# Patient Record
Sex: Male | Born: 1935 | Race: White | Hispanic: No | Marital: Married | Smoking: Never smoker
Health system: Southern US, Community
[De-identification: ages and names within clinical notes are randomized; demographics above are authoritative.]

## PROBLEM LIST (undated history)

## (undated) DIAGNOSIS — I714 Abdominal aortic aneurysm, without rupture, unspecified: Secondary | ICD-10-CM

## (undated) DIAGNOSIS — I359 Nonrheumatic aortic valve disorder, unspecified: Secondary | ICD-10-CM

## (undated) DIAGNOSIS — Z8619 Personal history of other infectious and parasitic diseases: Secondary | ICD-10-CM

## (undated) DIAGNOSIS — I471 Supraventricular tachycardia, unspecified: Secondary | ICD-10-CM

## (undated) DIAGNOSIS — I509 Heart failure, unspecified: Secondary | ICD-10-CM

## (undated) DIAGNOSIS — I4891 Unspecified atrial fibrillation: Secondary | ICD-10-CM

## (undated) HISTORY — DX: Abdominal aortic aneurysm, without rupture, unspecified: I71.40

## (undated) HISTORY — DX: Supraventricular tachycardia: I47.1

## (undated) HISTORY — DX: Heart failure, unspecified: I50.9

## (undated) HISTORY — PX: INGUINAL HERNIA REPAIR: SHX194

## (undated) HISTORY — DX: Personal history of other infectious and parasitic diseases: Z86.19

## (undated) HISTORY — DX: Supraventricular tachycardia, unspecified: I47.10

## (undated) HISTORY — DX: Nonrheumatic aortic valve disorder, unspecified: I35.9

## (undated) HISTORY — DX: Unspecified atrial fibrillation: I48.91

## (undated) HISTORY — DX: Abdominal aortic aneurysm, without rupture: I71.4

---
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
## 2003-07-06 HISTORY — PX: CHOLECYSTECTOMY: SHX55

## 2004-11-16 ENCOUNTER — Inpatient Hospital Stay (HOSPITAL_COMMUNITY): Admission: EM | Admit: 2004-11-16 | Discharge: 2004-11-23 | Payer: Self-pay | Admitting: Emergency Medicine

## 2004-11-17 ENCOUNTER — Encounter: Payer: Self-pay | Admitting: Cardiology

## 2004-11-17 ENCOUNTER — Encounter (INDEPENDENT_AMBULATORY_CARE_PROVIDER_SITE_OTHER): Payer: Self-pay | Admitting: *Deleted

## 2004-11-18 ENCOUNTER — Ambulatory Visit: Payer: Self-pay | Admitting: Cardiology

## 2007-10-13 ENCOUNTER — Encounter: Payer: Self-pay | Admitting: Cardiology

## 2007-10-13 ENCOUNTER — Ambulatory Visit: Payer: Self-pay | Admitting: Cardiology

## 2007-10-15 ENCOUNTER — Inpatient Hospital Stay (HOSPITAL_COMMUNITY): Admission: EM | Admit: 2007-10-15 | Discharge: 2007-10-17 | Payer: Self-pay | Admitting: Emergency Medicine

## 2007-10-30 ENCOUNTER — Ambulatory Visit: Payer: Self-pay | Admitting: Cardiovascular Disease

## 2007-11-29 ENCOUNTER — Ambulatory Visit: Payer: Self-pay | Admitting: Cardiology

## 2009-02-24 ENCOUNTER — Ambulatory Visit: Payer: Self-pay

## 2009-02-24 ENCOUNTER — Encounter: Payer: Self-pay | Admitting: Cardiology

## 2009-02-27 DIAGNOSIS — I471 Supraventricular tachycardia, unspecified: Secondary | ICD-10-CM | POA: Insufficient documentation

## 2009-02-27 DIAGNOSIS — I359 Nonrheumatic aortic valve disorder, unspecified: Secondary | ICD-10-CM | POA: Insufficient documentation

## 2009-02-27 DIAGNOSIS — Z8679 Personal history of other diseases of the circulatory system: Secondary | ICD-10-CM | POA: Insufficient documentation

## 2009-02-27 DIAGNOSIS — I509 Heart failure, unspecified: Secondary | ICD-10-CM | POA: Insufficient documentation

## 2009-02-27 DIAGNOSIS — I4891 Unspecified atrial fibrillation: Secondary | ICD-10-CM

## 2009-02-28 ENCOUNTER — Ambulatory Visit: Payer: Self-pay | Admitting: Cardiology

## 2009-02-28 DIAGNOSIS — I10 Essential (primary) hypertension: Secondary | ICD-10-CM | POA: Insufficient documentation

## 2009-09-28 ENCOUNTER — Emergency Department (HOSPITAL_BASED_OUTPATIENT_CLINIC_OR_DEPARTMENT_OTHER): Admission: EM | Admit: 2009-09-28 | Discharge: 2009-09-28 | Payer: Self-pay | Admitting: Emergency Medicine

## 2010-03-24 ENCOUNTER — Ambulatory Visit (HOSPITAL_COMMUNITY)
Admission: RE | Admit: 2010-03-24 | Discharge: 2010-03-24 | Payer: Self-pay | Source: Home / Self Care | Admitting: Cardiology

## 2010-03-24 ENCOUNTER — Ambulatory Visit: Payer: Self-pay | Admitting: Cardiology

## 2010-03-24 ENCOUNTER — Ambulatory Visit: Payer: Self-pay

## 2010-08-04 NOTE — Assessment & Plan Note (Signed)
Summary: fu 1 year    Primary Provider:  [facility]  CC:  check up.  History of Present Illness: Mr. Gavin Lane is 77 years who returned for followup management of atrial fibrillation and aortic stenosis. He had paroxysmal fibrillation a couple of years ago after receiving some diet pills. He was with rate control medications but no Coumadin since his respiratory was less than. He noticed [place] score of one. He was on but this was discontinued due to some hallucinations.  He also has AS with a meant AVG of 16 today by echo.  No recent cp, sob, palp.  Current Medications (verified): 1)  Aspirin 81 Mg Tbec (Aspirin) .... Take One Tablet By Mouth Daily 2)  Multivitamins   Tabs (Multiple Vitamin) .Marland Kitchen.. 1  Tab By Mouth Once Daily  Past History:  Family History: Last updated: 03-02-09  Mom died secondary to asthma.  Dad died in a train  accident.  Social History: Last updated: 03/02/09  Lives in [place].  He retired from [employer].  He is married.  No tobacco, alcohol or illicit drug use.  Past Medical History: Reviewed history from 02/28/2009 and no changes required. Current Problems:  AORTIC STENOSIS, MILD (ICD-424.1) PAROXYSMAL SUPRAVENTRICULAR TACHYCARDIA (ICD-427.0) ATRIAL FIBRILLATION, PAROXYSMAL (ICD-427.31) HEART FAILURE (ICD-428.9) ABDOMINAL AORTIC ANEURYSM, HX OF (ICD-V12.50) 1.  normal coronary angiography. 2009 2. Mild elevation of pulmonary wedge pressure of 18, consistent with     diastolic heart failure. 3. Paroxysmal atrial fibrillation. 4. Paroxysmal supraventricular tachycardia. 5. Mild aortic stenosis with mean aortic valve gradient of 15 mmHg     with trivial aortic insufficiency and LVH by echo 02/2009. 6. History of abdominal aneurysm, but only 2.4 x 2.5 cm on CT, not     qualifying for the diagnosis of aneurysm.     Review of Systems       ROS is negative except as outlined in HPI.   Vital Signs:  Patient profile:    77 year old male Height:      76 inches Weight:      221 pounds BMI:     27.00 Pulse rate:   73 / minute Resp:     14 per minute BP sitting:   147 / 85  (left arm)  Vitals Entered By: Kem Parkinson (March 24, 2010 3:27 PM)  Physical Exam  Additional Exam:  Gen. Well-nourished, in no distress   Neck: No JVD, thyroid not enlarged, no carotid bruits Lungs: No tachypnea, clear without rales, rhonchi or wheezes Cardiovascular: Rhythm regular, PMI not displaced,  heart sounds  normal, 2/6 harsh systolic ejection murmur at the left sternal edge, no peripheral edema, pulses normal in all 4 extremities. Abdomen: BS normal, abdomen soft and non-tender without masses or organomegaly, no hepatosplenomegaly. MS: No deformities, no cyanosis or clubbing   Neuro:  No focal sns   Skin:  no lesions    Impression & Recommendations:  Problem # 1:  PAROXYSMAL SUPRAVENTRICULAR TACHYCARDIA (ICD-427.0) He has a history of paroxysmal fibrillation but has had no recurrence. He is [place] Score one. He was intolerant to interpret as it is currently only on aspirin. The following medications were removed from the medication list:    Metoprolol Succinate 50 Mg Xr24h-tab (Metoprolol succinate) .Marland Kitchen... Take one-half tab  tablet by mouth daily as needed His updated medication list for this problem includes:    Aspirin 81 Mg Tbec (Aspirin) .Marland Kitchen... Take one tablet by mouth daily  Problem # 2:  AORTIC STENOSIS, MILD (ICD-424.1) He has mild aortic stenosis. His aortic valve gradient is 16 today. Have some LVH but good function. The following medications were removed from the medication list:    Metoprolol Succinate 50 Mg Xr24h-tab (Metoprolol succinate) .Marland Kitchen... Take one-half tab  tablet by mouth daily as needed  Problem # 3:  HYPERTENSION, BENIGN (ICD-401.1) His blood pressure was elevated today but it runs about 116/70 at home. The following medications were removed from the medication list:    Metoprolol  Succinate 50 Mg Xr24h-tab (Metoprolol succinate) .Marland Kitchen... Take one-half tab  tablet by mouth daily as needed His updated medication list for this problem includes:    Aspirin 81 Mg Tbec (Aspirin) .Marland Kitchen... Take one tablet by mouth daily  Other Orders: EKG w/ Interpretation (93000)  Patient Instructions: 1)  Your physician recommends that you continue on your current medications as directed. Please refer to the Current Medication list given to you today. 2)  Your physician wants you to follow-up in: 1 year with Dr. Shirlee Latch.  You will receive a reminder letter in the mail two months in advance. If you don't receive a letter, please call our office to schedule the follow-up appointment.

## 2010-09-28 LAB — CBC
MCHC: 33.9 g/dL (ref 30.0–36.0)
MCV: 90.9 fL (ref 78.0–100.0)
Platelets: 224 10*3/uL (ref 150–400)
RBC: 5.05 MIL/uL (ref 4.22–5.81)
RDW: 12.5 % (ref 11.5–15.5)

## 2010-09-28 LAB — DIFFERENTIAL
Lymphocytes Relative: 26 % (ref 12–46)
Monocytes Absolute: 0.9 10*3/uL (ref 0.1–1.0)
Neutro Abs: 6.3 10*3/uL (ref 1.7–7.7)
Neutrophils Relative %: 63 % (ref 43–77)

## 2010-09-28 LAB — BASIC METABOLIC PANEL
CO2: 27 mEq/L (ref 19–32)
Chloride: 107 mEq/L (ref 96–112)
Creatinine, Ser: 1.1 mg/dL (ref 0.4–1.5)
GFR calc non Af Amer: >60 mL/min (ref >60)
Glucose, Bld: 102 mg/dL — ABNORMAL HIGH (ref 70–99)
Sodium: 142 mEq/L (ref 135–145)

## 2010-11-17 NOTE — Cardiovascular Report (Signed)
**Note Gavin-Identified via Obfuscation** NAME:  [name], Gavin Lane NO.:  [id number]   MEDICAL RECORD NO.:  [id number]          PATIENT TYPE:  OBV   LOCATION:  4735                         FACILITY:  [facility]   PHYSICIAN:  Rollene Rotunda, MD, FACCDATE OF BIRTH:  March 04, 1930   DATE OF PROCEDURE:  10/16/2007  DATE OF DISCHARGE:                            CARDIAC CATHETERIZATION   PRIMARY CARE PHYSICIAN:  Dr. Nelson Chimes.   CARDIOLOGIST:  Macarthur Critchley. Torelli, MD   PROCEDURE:  Left and right heart catheterization/coronary arteriography.   INDICATIONS:  The patient with chest pain and aortic stenosis.   PROCEDURE NOTE:  Left heart catheterization performed via right femoral  artery, right heart catheterization performed via right femoral vein.  Both vessels are cannulated using the anterior wall puncture.  A #6-  [place] arterial sheath and #7-French venous sheath were inserted via the  Seldinger technique.  Preformed Judkins, pigtail, and a Swan-Ganz  catheter were utilized.  The patient tolerated procedure well and he  left the lab in stable condition.   RESULTS:  Hemodynamics RA mean 6, RV 25/4, PA 41/5 with a mean of 19,  pulmonary capillary pressure mean 18, LV 100/16, AL 95/57, the mean  gradient across the aortic valve was only 6.8.  Cardiac output/cardiac  index (Fick) 4.4/2.  Coronaries left mean was normal.  The LAD had mid  luminal irregularities.  There was two small diagonals which were  normal.  Circumflex in the AV groove was normal.  Ramus intermediate was  large and normal.  Mid obtuse marginal was large and normal.  Posterolaterals x2 were small and normal.  The right coronary artery was  a large dominant vessel.  There was approximately 25% stenosis.  The PDA  was moderate sized and normal.   Left Ventriculogram:  The left ventriculogram was obtained in RAO  projection.  The EF was 65% and normal.   CONCLUSION:  Minimal coronary artery plaque.  Mildly elevated left  ventricular end-diastolic pressure  and pulmonary capillary wedge  pressure.  No significant aortic valve stenosis, but did have some  trouble crossing requiring a straight wire and a long exchange wire.   PLAN:  The patient will have medical management.  Of note, during the  procedure, the patient had significant supraventricular tachycardia with  intermittent sinus rhythm.  He was noted to have sustained  supraventricular  tachycardia on telemetry this morning.  Dr. Juanda Chance has started him on  beta blockers.  This can be followed by Dr. Shelva Majestic going forward.  Also, the patient was noted to have apneic episodes with significant  snoring after getting some sedation.  This can be evaluated as an  outpatient.      Rollene Rotunda, MD, [facility]  Electronically Signed     JH/MEDQ  D:  10/16/2007  T:  10/17/2007  Job:  [id number]   cc:   Macarthur Critchley. Shelva Majestic, M.D.  Dr. Nelson Chimes

## 2010-11-17 NOTE — Assessment & Plan Note (Signed)
[facility]                                 ON-CALL NOTE   NAME:G[facility] Lane, Gavin Lane                      MRN:          [id number]  DATE:10/17/2007                            DOB:          09/25/1929    PRIMARY CARDIOLOGIST:  Dr. Everardo Beals. Brodie.   Mr. Cowles was discharged from the hospital on October 17, 2007.  His  wife stated that she was given a prescription for metoprolol 50 mg  b.i.d.  She stated that he had been on metoprolol 25 mg b.i.d., and she  fell to like he would not tolerate a dose increase.  Upon review of the  records, it turns out that he had been given one dose of metoprolol 50  mg prior to discharge, but his systolic blood pressure before that was  101, heart rate 88.  She did not feel that he would tolerate the  metoprolol 50 mg b.i.d., which was double his previous dose.  She was  uncomfortable getting this filled.  I advised her that it would be  appropriate to get the prescription filled, but take 1/2 tablet twice a  day instead of a whole tablet and discuss the matter with Dr. Juanda Chance,  when she and her husband see him in follow-up.  She was in agreement  with this as the plan of care.      Theodore Demark, PA-C  Electronically Signed      Everardo Beals Juanda Chance, MD, [facility]  Electronically Signed   RB/MedQ  DD: 10/18/2007  DT: 10/18/2007  Job #: [id number]

## 2010-11-17 NOTE — Discharge Summary (Signed)
NAME:  ADRIELL, POLANSKY NO.:  [id number]   MEDICAL RECORD NO.:  [id number]          PATIENT TYPE:  INP   LOCATION:  4735                         FACILITY:  [facility]   PHYSICIAN:  Everardo Beals. Juanda Chance, MD, FACCDATE OF BIRTH:  1930/03/30   DATE OF ADMISSION:  10/13/2007  DATE OF DISCHARGE:  10/17/2007                               DISCHARGE SUMMARY   PRIMARY CARDIOLOGIST:  Everardo Beals. Juanda Chance, MD.   PRIMARY CARE Kaivon Livesey:  Dr. Nelson Chimes.   DISCHARGE DIAGNOSIS:  Chest pain.   SECONDARY DIAGNOSES:  1. Paroxysmal atrial fibrillation, currently refusing Coumadin      therapy.  2. Paroxysmal SVT.  3. Mild aortic stenosis.  4. Reported abdominal aortic aneurysm with normal aorta at this      admission.  5. History of nephrolithiasis.  6. Moderate aortic insufficiency.  7. Nonobstructive coronary artery disease.   ALLERGIES:  NO KNOWN DRUG ALLERGIES.   PROCEDURE:  Left heart cardiac catheterization to the echocardiogram.   HISTORY OF PRESENT ILLNESS:  A 78 year old male without prior history of  coronary artery disease.  He is in his usual state of health until October 13, 2007, when he had a sudden onset of substernal chest tightness with  decrease in visual field, lasting approximately 30 minutes, resolving  with rest.  In the emergency room he was known to be orthostatic by  blood pressure and heart rate.  Cardiac markers were normal and ECG  showed sinus tachycardia with no acute ST-T changes.  The patient was  admitted for further evaluation.   HOSPITAL COURSE:  The patient was gently hydrated secondary to  orthostasis and decision was made to pursue left heart cardiac  catheterization, scheduled for October 16, 2007.  The patient ruled out  for MI.  In the early morning hours of October 16, 2007, the patient was  noted to have supraventricular tachycardia which was paroxysmal and  broke spontaneously.  He had underwent left heart cardiac  catheterization on October 16, 2007,  which showed normal coronary arteries  with mild elevation of his wedge at 18 mmHg.  Cardiac output was 4.4 as  well as index was 2.0 liters per minute per meter square.  EF was 65%.  The patient tolerated this procedure well and post catheterization was  noted to have atrial fibrillation.  We discussed initiating Coumadin  therapy.  However, the patient at this time does not want to take  Coumadin.  He will be therefore initiated on aspirin 325 mg daily.  We  have also initiated Lopressor 50 mg b.i.d. which, up to this point, the  patient has tolerated.  We have arranged for discharge today with follow  up with Dr. Regino Schultze nurse practitioner on October 30, 2007, and  subsequent followup with Dr. Juanda Chance on May 27.  Mr. Thain is being  discharged home today in good condition.  With his history of the  abdominal aortic aneurysm, an aortic ultrasound was performed on October 15, 2007, showing no focal aneurysm with aortic maximum diameter of 2.4  x 2.5 cm.   DISCHARGE LABS:  Hemoglobin 15.6, hematocrit  45.8, WBC 11.2, platelets  248, INR 1.1, sodium 140, potassium 4.9, chloride 106, CO2 of 27, BUN  17, creatinine 1.18, glucose 99, calcium 9.0, CK 82, MB 1.7, troponin I  of 0.05, total cholesterol 110, triglycerides 154, HDL 30, and LDL 49.   DISPOSITION:  The patient is being discharged home today in good  condition.   FOLLOWUP PLAN AND APPOINTMENT:  The patient has follow up with Dr.  Regino Schultze nurse practitioner on October 30, 2007, at 8:45 a.m.  Follow up  Dr. Juanda Chance on Nov 29, 2007, at 4:15 p.m.  He has to follow up with Dr.  Nelson Chimes as previously scheduled.   DISCHARGE MEDICATIONS:  1. Lopressor 50 mg b.i.d.  2. Aspirin 325 mg daily.  3. Vitamin D.  4. Niacin.  5. Lypocom.  6. Magnesium.  7. Multivitamin.  8. Flaxseed oil.  9. Fish oil.  10.Juice Plus, all as previously taken.   OUTSTANDING LAB STUDIES:  None.   DURATION DISCHARGE ENCOUNTER:  35 minutes including physician  time.      Nicolasa Ducking, ANP      Bruce R. Juanda Chance, MD, [facility]  Electronically Signed    CB/MEDQ  D:  10/17/2007  T:  10/18/2007  Job:  [id number]   cc:   Dr. Nelson Chimes

## 2010-11-17 NOTE — Assessment & Plan Note (Signed)
[facility]                            CARDIOLOGY OFFICE NOTE   NAME:Gavin Lane, Gavin Lane                    MRN:          [id number]  DATE:10/30/2007                            DOB:          [birth year]    PRIMARY CARDIOLOGIST:  Dr. Juanda Chance.   PRIMARY CARE Judyth Demarais:  Dr. Ivory Broad.   PATIENT PROFILE:  A 77 year old Caucasian male with recent admission for  chest pain who presents for followup.   PROBLEMS:  1. History of chest pain.      a.     October 16, 2007 cardiac catheterization revealing normal       coronary arteries with an EF 65%.  Right heart catheterization       revealed a wedge pressure of 18 mmHg.  Cardiac output was 4.4 and       index was 2.0.  2. Paroxysmal atrial fibrillation refusing Coumadin therapy.  3. Paroxysmal supraventricular tachycardia.  4. Mild aortic stenosis.  5. Moderate aortic insufficiency.  6. History of nephrolithiasis.  7. Reported history of abdominal aortic aneurysm with a CT October 15, 2007 showing no focal aneurysm with aortic maximum diameter 2.4 x      2.5 cm.   HISTORY OF PRESENT ILLNESS:  A 77 year old Caucasian male with the above  problem list.  He was admitted to [facility] on April 10 following  sudden onset of substernal chest tightness with decrease in visual field  lasting 30 minutes and resolving with rest.  In the emergency room he  was noted be orthostatic and cardiac markers were normal.  He was  admitted for evaluation.  He underwent left heart cardiac  catheterization April 13 showing normal coronary arteries, normal LV  function.  During and post catheterization he was having asymptomatic  atrial fibrillation.  He was placed on beta blocker therapy.  The  patient refused Coumadin and was placed on full strength aspirin.  Since  discharge he has done well without recurrence of chest discomfort,  shortness of breath, or palpitations.  He has not had any limitations in  activities.  He did  note some nasal and sinus congestion about a week  ago and when he would blow his nose, there was some blood in his  discharge.  As result of this, his wife cut his aspirin from 325 to 2  baby aspirin instead.  He has not had any additional bleeding for about  a week now.  He is tolerating the medications well and his groin has  also healed up well.   ALLERGIES:  No known drug allergies.   HOME MEDICATIONS:  1. Lopressor 50 mg b.i.d.  2. Aspirin 325 mg daily.  3. Vitamin D daily.  4. Niacin daily.  5. Lycopene daily.  6. Magnesium daily.  7. Multivitamin daily.  8. Flax seed oil daily.  9. Fish oil daily.  10.Juice plus daily.   PHYSICAL EXAM:  Blood pressure 146/80.  However, on repeat was 128/56,  heart rate 60, respirations 16, weight is 217 pounds.  Pleasant white male in no acute distress.  Awake,  alert x3.  HEENT is normal.  NEURO:  Grossly intact, nonfocal.  Skin is warm, dry without lesions or masses.  NECK:  No bruits, JVD.  LUNGS:  Respirations are unlabored.  Clear to auscultation.  CARDIAC:  Regular S1, S2.  2/6 systolic murmur loudest the right sternal  border but heard throughout.  ABDOMEN:  Soft, nontender.  Bowel sounds x4.  EXTREMITIES:  Warm, dry, pink.  No clubbing, cyanosis or edema.  Dorsalis pedis post tibial pulses 1+ bilaterally.  The right groin is  clear bleeding, bruising, hematoma.   ACCESSORY CLINICAL FINDINGS:  EKG shows sinus bradycardia rate of 59  beats per minute with a left axis deviation and no acute ST-T changes.   ASSESSMENT:  1. History of chest pain.  The patient underwent catheterization      revealing normal coronary arteries and normal LV function.  2. Paroxysmal atrial fibrillation.  This was asymptomatic.  He is in      sinus rhythm today remains maintained on 50 mg of b.i.d. metoprolol      along with currently 162 mg of aspirin.  We did discuss aspirin      usage versus Coumadin in atrial fibrillation and I advised that       they try 325 mg again.  However, that if he cannot tolerate 325      that 162 will have to do.  The patient and wife reiterated not      interested in Coumadin.  His CHADS score is only 1 for age of 70.      He has no prior history of heart failure, hypertension, diabetes or      stroke.  3. History of paroxysmal SVT no complaints.  He remains on beta      blocker therapy.  4. Mild aortic stenosis and moderate aortic insufficiency.  This was      by echo during his hospitalization.  Follow-up echo in the year.   DISPOSITION:  The patient has followup already scheduled Dr. Juanda Chance in 1  month.      Nicolasa Ducking, ANP  Electronically Signed      Noralyn Pick. Eden Emms, MD, [facility]  Electronically Signed   CB/MedQ  DD: 10/30/2007  DT: 10/30/2007  Job #: [id number]

## 2010-11-17 NOTE — Assessment & Plan Note (Signed)
[facility]                            CARDIOLOGY OFFICE NOTE   Lane, Gavin Lane                    MRN:          [id number]  DATE:11/29/2007                            DOB:          02-15-1930    PRIMARY CARE PHYSICIAN:  [facility] in [facility]   CLINICAL HISTORY:  Gavin Lane is 77 years old and was recently admitted  for chest pain.  He underwent catheterization and was found to have  normal coronary angiography.  He also had paroxysmal atrial fibrillation  and noted while he was in the hospital and there was some discussion  about Coumadin therapy, but he did not want to take Coumadin therapy.  He also has a mild aortic stenosis with a mean aortic valve gradient 12  mmHg.  He had a previous history of aortic aneurysm, but his ultrasound  showed only a size of 2.4 x 2.5 cm, which does not qualify for the  diagnosis of aneurysm.  He had been previously followed by Gavin Lane,  but decided to a continued cardiac followup here with me.   He has done quite well since his discharge from the hospital.  He has  had no recurrent chest pain and no recurrent palpitations.   PAST MEDICAL HISTORY:  Hyperlipidemia.   CURRENT MEDICATIONS:  1. Lopressor 50 mg b.i.d.  2. Niacin.  3. Magnesium.  4. Flax seed oil.  5. Fish oil.  6. Aspirin.   PHYSICAL EXAMINATION:  Blood pressure was 132/80, pulse 79 and regular.  There was no venous tension.  The carotid pulses were full without  bruits.  CHEST:  Was clear.  CARDIAC:  Rhythm was regular.  There is a grade 2/6 harsh systolic  ejection murmur left sternal edge radiating to the base.  I could hear  no diastolic murmur.  S2 was single.  ABDOMEN:  Soft, with normal bowel sounds.  There is no  hepatosplenomegaly.  Peripheral pulses were full. There is no peripheral edema.   IMPRESSION:  1. Recent admission for chest pain with normal coronary angiography.  2. Mild elevation of pulmonary  wedge pressure of 18, consistent with      diastolic heart failure.  3. Paroxysmal atrial fibrillation.  4. Paroxysmal supraventricular tachycardia.  5. Mild aortic stenosis with mean aortic valve gradient of 12 mmHg      with trivial aortic insufficiency.  6. History of abdominal aneurysm, but only 2.4 x 2.5 cm on CT, not      qualifying for the diagnosis of aneurysm.   RECOMMENDATIONS:  I think Gavin Lane is doing quite well.  He did have  some hallucination which he possibly related to the Lopressor, but these  have resolved and are not currently a problem.  Will plan to continue on  Lopressor and aspirin.  We also discussed Coumadin therapy again.  He is  [place] score 1, based on his age.  I think it is not unreasonable to treat  him with aspirin alone.  I will plan to see him back in a year.  Will do  a followup echocardiogram prior  to that visit.  If he has any recurrence  of symptoms of tachy palpitations, he is let us know.     Bruce Elvera Lennox Juanda Chance, MD, [facility]  Electronically Signed    BRB/MedQ  DD: 11/29/2007  DT: 11/29/2007  Job #: [id number]

## 2010-11-17 NOTE — Discharge Summary (Signed)
NAME:  Gavin Lane, Gavin Lane NO.:  [id number]   MEDICAL RECORD NO.:  [id number]          PATIENT TYPE:  OBV   LOCATION:  4735                         FACILITY:  [facility]   PHYSICIAN:  Theodore Demark, PA-C   DATE OF BIRTH:  Nov 01, 1929   DATE OF ADMISSION:  10/13/2007  DATE OF DISCHARGE:                               DISCHARGE SUMMARY   PRIMARY CARE PHYSICIAN:  Dr. Nelson Chimes at [facility] practice.   PRIMARY CARDIOLOGIST:  Macarthur Critchley. Shelva Majestic, MD   CHIEF COMPLAINT:  Chest pain.   HISTORY OF PRESENT ILLNESS:  Mr. Haberle is a 77 year old male with no  previous history of coronary artery disease.  He had sudden onset of  dizziness that was associated with chest tightness and decrease in his  visual field today at approximately 10 a.m.  The chest pain reached to  5/10.  There were no associated symptoms and there were no palpitations.  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  He rested but he still had symptoms, however, they resolved in  about 30 minutes.  He did not take any medications or try anything.  They were not positional and the pain did not change with deep  inspiration.  He has never had symptoms like this before.  Approximately  a month ago, he had a balance problem while cleaning a horse stall and  stated that he had to keep putting the hand to the wall to maintain his  balance.  However, there was no associated chest pain, dizziness, or  changes in his visual field with this, so he feels that these symptoms  were not like the ones he had today and they also resolved  spontaneously.   PAST MEDICAL HISTORY:  1. He has known history of diabetes, hypertension, hyperlipidemia,      family history of premature coronary artery disease or significant      tobacco use.  2. Status post cardiac evaluation preoperatively in 2006 with an      echocardiogram showed mild aortic stenosis with a mean gradient of      50 mmHg.  3. AAA  diagnosed in 2006 of 3.4 cm, checked by Dr. Shelva Majestic in 2007      with no further details available.  4. History of nephrolithiasis.   SURGICAL HISTORY:  He is status post lithotripsy, cholecystectomy, and  cataract surgery.   ALLERGIES:  No known drug allergies.   CURRENT MEDICATIONS:  1. Aspirin 81 mg a day.  2. Multivitamin and multiple other vitamin supplements daily.   SOCIAL HISTORY:  Lives in [place] with his wife and is retired from  [employer].  He used to smoke cigars occasionally, but quit  completely 2 years ago, and has never abused alcohol or drugs.   FAMILY HISTORY:  His mother died at age 67 with a history of pneumonia  but not heart disease, and his father was killed during World War II at  age 70 but did not have heart disease.  He has 1 sister that is  deceased, but she did not  have heart disease.   REVIEW OF SYSTEMS:  He had a mild upper respiratory infection that did  not require antibiotic therapy last week but this has resolved.  He  coughs occasionally.  He does not complain much of thirst and says he  does not drink very much water but drinks a lot of tea.  He denies  indigestion, heartburn, or reflux.  There has been no hematemesis,  hemoptysis, or melena.  He has had no fevers or chills.  A 14-point  review of systems is otherwise negative.   PHYSICAL EXAMINATION:  VITAL SIGNS:  Temperature is 98.5, blood pressure  103/52, pulse 106, respiratory rate 16, O2 saturation 95% on room air.  GENERAL:  He is a well-developed, well-nourished white male in no acute  distress.  HEENT:  Normal.  NECK:  There is no lymphadenopathy, thyromegaly, or JVD noted, but his  murmur does radiate to his left carotid more so than his right carotid.  CV:  His heart is regular in rate and rhythm with an S1-S2, and a 2/6  typical aortic stenosis murmur is noted at the left upper sternal  border.  Distal pulses are intact in all 4 extremities.  LUNGS:  Clear to  auscultation bilaterally.  SKIN:  No rashes or lesions are noted.  ABDOMEN:  Soft, nontender with active bowel sounds.  EXTREMITIES:  There is no cyanosis, clubbing, or edema noted.  MUSCULOSKELETAL:  There is no joint deformity or effusions and no spine  or CVA tenderness.  NEURO:  He is alert and oriented.  Cranial nerves II-XII grossly intact.   Chest x-ray shows mild COPD but no acute cardiopulmonary disease.   EKG is sinus tachycardia rate 101 with no acute ischemic changes.   LABORATORY VALUES:  Hemoglobin 16.1, hematocrit 47, WBCs 11.1, platelets  245,000.  Sodium 138, potassium 4.0, chloride 107, CO2 22, BUN 60,  creatinine 1.1, glucose 101.  GFR greater than 30, PTT 36.  Troponin I  0.03, CK-MB 130/2.6, and lipid profile is pending.   IMPRESSION:  1. Chest pain.  We will admit and rule out myocardial infarction.  If      his cardiac enzymes are negative, outpatient Myoview can be      considered but a treadmill test would probably be best because of      his aortic stenosis.  2. Aortic stenosis.  We will check an echocardiogram to assess his      valve and his EF.  3. Orthostatic hypotension:  His blood pressure lying was 125/81 with      a heart rate of 94 and standing was 103/62 with a heart rate of      106.  Because of the aortic stenosis, he will be hydrated gently.      He is encouraged to substitute water for the tea but not force      fluid.      Theodore Demark, PA-C     RB/MEDQ  D:  10/13/2007  T:  10/14/2007  Job:  [id number]   cc:   Noralee Chars, MD  Macarthur Critchley. Shelva Majestic, M.D.

## 2010-11-20 NOTE — Discharge Summary (Signed)
NAME:  ALAIN, DESCHENE NO.:  [id number]   MEDICAL RECORD NO.:  [id number]          PATIENT TYPE:  INP   LOCATION:  5002                         FACILITY:  [facility]   PHYSICIAN:  Adolph Pollack, M.D.DATE OF BIRTH:  1929/08/30   DATE OF ADMISSION:  11/16/2004  DATE OF DISCHARGE:  11/23/2004                                 DISCHARGE SUMMARY   DISCHARGE DIAGNOSES:  1.  Acute cholecystitis status post laparoscopic cholecystectomy with      intraoperative cholangiogram on Nov 17, 2004.  2.  Mild aortic stenosis.  3.  Small abdominal aortic aneurysm 3.4 cm.  4.  History of nephrolithiasis status post lithotripsy.   HOSPITAL COURSE:  Mr. Gavin Lane is a 78 year old male patient who presented  with a one-day history of intractable right upper quadrant pain associated  with nausea and vomiting.  He had a similar episode two months ago that  spontaneously resolved.  In the emergency room a gallbladder ultrasound  showed cholelithiasis and blood work showed leukocytosis.  He was placed on  IV antibiotics, pain control and the following day he underwent a  cholecystectomy laparoscopically with intraoperative cholangiogram.  He was  found to have acute cholecystitis with a gangrenous gallbladder.  A drain  was placed intraoperatively.   During his hospital stay he was noted to have a systolic murmur and he was  seen by Dr. Charlies Constable of [facility].  He was found to have mild  aortic stenosis.  He will follow this up with Dr. Harland Dingwall in [facility]   In addition, he was found to have a 3.4 cm abdominal aortic aneurysm when he  did have his abdominal ultrasound.  We have asked him to follow up with Dr.  Shelva Majestic regarding the aneurysm as well.   Postoperatively he did well.  The only complication is that he did have a  postoperative ileus that has resolved upon discharge.   On postoperative day #6 he was ready for discharge to home.  He was  discharged home  in stable condition.  He was given a post cholecystectomy  discharge sheet which included his activity, meals, wound care.   His only medication is Vicodin 5/500 one to two tablets every six hours as  needed for pain, #30, no refills.  I have set him up an appointment to  follow up in the office.      LB/MEDQ  D:  11/23/2004  T:  11/23/2004  Job:  [id number]   cc:   Vikki Ports, MD  [address]  [place]  [place] [address]   Macarthur Critchley. Shelva Majestic, M.D.  [address]  [address]  [place]  [place] [address]  Fax: [phone]

## 2010-11-20 NOTE — Op Note (Signed)
NAME:  ZIA, KANNER NO.:  [id number]   MEDICAL RECORD NO.:  [id number]          PATIENT TYPE:  INP   LOCATION:  5002                         FACILITY:  [facility]   PHYSICIAN:  Vikki Ports, MDDATE OF BIRTH:  1930/04/12   DATE OF PROCEDURE:  11/17/2004  DATE OF DISCHARGE:                                 OPERATIVE REPORT   PREOPERATIVE DIAGNOSIS:  Acute cholecystitis.   POSTOPERATIVE DIAGNOSIS:  Gangrenous cholecystitis.   PROCEDURE:  Laparoscopic cholecystectomy with intraoperative cholangiogram.   SURGEON:  Vikki Ports, M.D.   ASSISTANT:  Adolph Pollack, M.D.   ANESTHESIA:  General.   DESCRIPTION OF PROCEDURE:  The patient was taken to the operating room and  placed in the supine position.  After adequate general anesthesia was  induced, using an endotracheal tube, the abdomen was prepped and draped in a  normal sterile fashion.  Using a transverse infraumbilical incision, I  dissected down to the fascia and this was opened vertically.  An 0 Vicryl  pursestring suture was placed around the fascial defect.  The Hasson trocar  was placed in the abdomen and the abdomen was insufflated with continuous  flow carbon dioxide.  Under the gallbladder which was very edematous and had  an enormous stone within it was decompressed using the Nilstat aspirator.  Grasping the gallbladder was very, very difficult.  It was very large and  had a fist-sized stone within it.  Dissection down to the neck visualized a  cystic duct.  The dissection was difficult but a good window was created  posterior to it.  The duct looked somewhat necrotic but cholangiogram was  performed which showed normal flow into the duodenum.  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  There was no evidence of erosion  into the duct.  The duct was then doubly clipped.  The cystic artery  was  dissected in a similar fashion, clipped and divided.  The gallbladder was  taken off the gallbladder bed with some difficulty using electrocautery.  Adequate hemostasis was assured and Surgicel was placed within the  gallbladder bed.  The gallbladder was placed in an endo-catch bag.  No  feeling completely comfortable with the closure of the cystic duct secondary  to the amount of inflammation, I opted to place a 19 Blake drain near the  cystic duct stump.  The gallbladder was then brought toward the umbilical  incision but because of the enormous size of the stone, the fascial incision  was extended to about 4-5 cm to allow removal of the gallbladder.  The  fascia was then closed with interrupted #1 Novofil.  The skin incisions were  closed with staples.  Dressings were applied.  The patient tolerated the  procedure well and went to PACU in good condition.     KRH/MEDQ  D:  11/19/2004  T:  11/19/2004  Job:  [id number]

## 2010-11-20 NOTE — H&P (Signed)
NAME:  Gavin Lane, Gavin Lane NO.:  [id number]   MEDICAL RECORD NO.:  [id number]          PATIENT TYPE:  INP   LOCATION:  1826                         FACILITY:  [facility]   PHYSICIAN:  Vikki Ports, MDDATE OF BIRTH:  12/02/1929   DATE OF ADMISSION:  11/16/2004  DATE OF DISCHARGE:                                HISTORY & PHYSICAL   REASON FOR CONSULTATION TO THE ER:  Cholecystitis.   HISTORY OF PRESENT ILLNESS:  Mr. Renz is a 77 year old male patient who  had an episode of right upper quadrant pain about two months ago, with  spontaneous resolution.  He experienced another episode of the same pain  yesterday and this became intractable.  The pain worsened and he presented  to the emergency room.  He has had right upper quadrant pain, nausea, as  well as vomiting.  No diarrhea.  The gallbladder ultrasound revealed  cholelithiasis.  He has a fever, as well as leukocytosis.  We have been  consulted by the emergency room physician for admission, as well as a  cholecystectomy.   ALLERGIES:  No known drug allergies.   MEDICATIONS:  Nexium p.r.n.   PAST MEDICAL HISTORY:  Nephrolithiasis, with what sounds to be a  lithotripsy.   SOCIAL HISTORY:  Lives in [place].  He retired from [employer].  He is married.  No tobacco, alcohol or illicit drug use.   FAMILY HISTORY:  Mom died secondary to asthma.  Dad died in a train  accident.   REVIEW OF SYSTEMS:  Nausea, vomiting, right upper quadrant pain.  Not noted  chest pain, shortness of breath, and all other systems are negative.   PHYSICAL EXAMINATION:  VITAL SIGNS:  Temperature 100.6 degrees, pulse 76,  respirations 20, blood pressure 140/90.  GENERAL:  He is in no acute distress.  HEENT:  Grossly normal.  Discharge through the nares clear.  Conjunctivae  normal.  NECK:  No carotid or supraclavicular bruits.  No jugular venous distention  or thyromegaly.  NODES:  No lymphadenopathy.  HEART:   A regular rate and rhythm.  No murmurs or ectopy.  LUNGS:  Clear.  ABDOMEN:  Soft, nondistended.  No masses or bruits.  No scars or herniae.  He does have positive Murphy's sign with right upper quadrant pain.  SKIN:  Warm and dry.  EXTREMITIES:  No clubbing, cyanosis or edema.  MUSCULOSKELETAL:  No joint deformities or CVA tenderness.  NEUROLOGIC:  Cranial nerves II-XII  grossly intact.  Alert and oriented x3.   An ultrasound shows cholelithiasis with sludge, positive Murphy's sign,  common bile duct 7 mm.  There is an incidental finding of an abdominal  aortic aneurysm of 3.4 cm.   LABORATORY DATA:  Reveals leukocytosis of 19,500.  His T-bilirubin is 2.1,  otherwise his liver function tests are normal.   ASSESSMENT:  1. Acute cholelithiasis.  2. History of nephrolithiasis.     PLAN:  At this point we will place him on antibiotics, pain control and plan  for a cholecystectomy in the morning.      LB/MEDQ  D:  11/16/2004  T:  11/16/2004  Job:  [id number]

## 2010-11-20 NOTE — Consult Note (Signed)
NAME:  Gavin Lane, Gavin Lane NO.:  [id number]   MEDICAL RECORD NO.:  [id number]          PATIENT TYPE:  INP   LOCATION:  5002                         FACILITY:  [facility]   PHYSICIAN:  Charlies Constable, M.D. [facility] DATE OF BIRTH:  1930-05-08   DATE OF CONSULTATION:  11/17/2004  DATE OF DISCHARGE:                                   CONSULTATION   REQUESTING PHYSICIANS:  Vikki Ports, M.D., and Burna Forts,  M.D.   REASON FOR CONSULTATION:  Preoperative evaluation prior to gallbladder  surgery.   CLINICAL HISTORY:  Mr. Bordas is 77 years old.  He has no prior history of  known heart disease.  He recently was evaluated for abdominal pain with an  ultrasound and found to have a grossly distended gallbladder.  We are seeing  him in the PACU for a preop evaluation because of a new heart murmur that  was detected on examination.  He has no prior history of known heart disease  and no history of a previous heart murmur.  He has had no symptoms of chest  pain, shortness of breath or palpitations.   PAST MEDICAL HISTORY:  Significant for nephrolithiasis and lithotripsy.  He  has also had cataract surgery.  There is no history of hypertension,  hyperlipidemia, diabetes, tobacco use.   He is currently on  no medications at home.   SOCIAL HISTORY:  He lives in [place] with his wife.  He is retired from  [employer].   For details of family history, social history and review of systems, please  see the complete note of Gene Serpe, P.A.   PHYSICAL EXAMINATION:  VITAL SIGNS:  Blood pressure is 109/92 and the pulse  90 and regular.  VITAL SIGNS:  There was no venous distention.  The carotid pulses were full.  I could not hear any definite transmitted murmur to the carotids.  CHEST:  Clear without rales or rhonchi.  CARDIAC:  Heart rhythm was regular.  The first heart sound was normal.  The  second heart sound was preserved.  There was a grade 2-3/6 systolic  ejection  murmur at the left sternal edge radiating toward the base.  I could hear no  diastolic murmur, and I could hear no gallop.  ABDOMEN:  Right upper quadrant tenderness.  Bowel sounds were present.  I  did not push hard on the gallbladder because of his known infection.  EXTREMITIES:  No edema, and the pedal pulses were good.  MUSCULOSKELETAL:  No deformity.  NEUROLOGIC:  No focal neurologic signs.   Chest x-ray showed bilateral atelectasis.  There is a question of right lung  nodularity at the base.  An ECG was normal.   IMPRESSION:  1.  Preoperative evaluation prior to gallbladder surgery for cholecystitis.  2.  Systolic murmur consistent with aortic stenosis, mild.   RECOMMENDATIONS:  Dr. Jacklynn Bue did a portable echo, and his interpretation  was that this showed a mild aortic stenosis and mild aortic insufficiency  with mild thickening of the left ventricular walls.  I think the patient is  okay to  proceed with surgery.  He is currently on antibiotics for his  gallbladder, which should cover him for SBE prophylaxis.  Will help to  arrange cardiology follow-up before his discharge from the hospital.      BB/MEDQ  D:  11/17/2004  T:  11/17/2004  Job:  [id number]   cc:   Vikki Ports, MD  [address]  [place]  [place] [address]   Burna Forts, M.D.  [facility] [address]  [place]  [place] [address]  Fax: [phone]

## 2011-03-30 LAB — BASIC METABOLIC PANEL
BUN: 16
CO2: 27
Calcium: 9
Calcium: 9.3
Chloride: 106
Chloride: 107
GFR calc Af Amer: >60
GFR calc non Af Amer: >60
Glucose, Bld: 101 — ABNORMAL HIGH
Potassium: 4
Sodium: 138
Sodium: 140

## 2011-03-30 LAB — PROTIME-INR
INR: 1.1
Prothrombin Time: 13.1
Prothrombin Time: 14.1

## 2011-03-30 LAB — CBC
HCT: 45.8
Hemoglobin: 15.6
MCHC: 34.1
MCHC: 34.3
MCV: 89.7
MCV: 90.1
Platelets: 248
RBC: 5.25
WBC: 11.1 — ABNORMAL HIGH

## 2011-03-30 LAB — APTT: aPTT: 36

## 2011-03-30 LAB — POCT I-STAT 3, VENOUS BLOOD GAS (G3P V)
O2 Saturation: 59
Operator id: 256671
TCO2: 26
pCO2, Ven: 41.5 — ABNORMAL LOW
pH, Ven: 7.382 — ABNORMAL HIGH

## 2011-03-30 LAB — CK TOTAL AND CKMB (NOT AT ARMC): Relative Index: 2

## 2011-03-30 LAB — LIPID PANEL
HDL: 30 — ABNORMAL LOW
Triglycerides: 154 — ABNORMAL HIGH

## 2011-03-30 LAB — POCT I-STAT 3, ART BLOOD GAS (G3+)
Bicarbonate: 22.9
pCO2 arterial: 32.7 — ABNORMAL LOW
pH, Arterial: 7.454 — ABNORMAL HIGH
pO2, Arterial: 55 — ABNORMAL LOW

## 2011-03-30 LAB — CARDIAC PANEL(CRET KIN+CKTOT+MB+TROPI)
Relative Index: 1.6
Relative Index: INVALID
Total CK: 112
Troponin I: 0.05
Troponin I: 0.06

## 2011-03-30 LAB — TROPONIN I: Troponin I: 0.03

## 2011-05-26 ENCOUNTER — Encounter: Payer: Self-pay | Admitting: *Deleted

## 2011-05-31 ENCOUNTER — Encounter: Payer: Self-pay | Admitting: Cardiology

## 2011-05-31 ENCOUNTER — Ambulatory Visit (INDEPENDENT_AMBULATORY_CARE_PROVIDER_SITE_OTHER): Payer: Medicare Other | Admitting: Cardiology

## 2011-05-31 DIAGNOSIS — I4891 Unspecified atrial fibrillation: Secondary | ICD-10-CM

## 2011-05-31 DIAGNOSIS — I359 Nonrheumatic aortic valve disorder, unspecified: Secondary | ICD-10-CM

## 2011-05-31 DIAGNOSIS — I10 Essential (primary) hypertension: Secondary | ICD-10-CM

## 2011-05-31 DIAGNOSIS — R0602 Shortness of breath: Secondary | ICD-10-CM

## 2011-05-31 LAB — LIPID PANEL
Cholesterol: 120 mg/dL (ref 0–200)
VLDL: 31.6 mg/dL (ref 0.0–40.0)

## 2011-05-31 LAB — CBC WITH DIFFERENTIAL/PLATELET
Basophils Absolute: 0 10*3/uL (ref 0.0–0.1)
Eosinophils Absolute: 0.2 10*3/uL (ref 0.0–0.7)
Hemoglobin: 16.6 g/dL (ref 13.0–17.0)
Lymphocytes Relative: 23 % (ref 12.0–46.0)
MCHC: 34.4 g/dL (ref 30.0–36.0)
Monocytes Relative: 8.3 % (ref 3.0–12.0)
Neutrophils Relative %: 66.7 % (ref 43.0–77.0)
Platelets: 235 10*3/uL (ref 150.0–400.0)
RDW: 13.2 % (ref 11.5–14.6)

## 2011-05-31 LAB — HEPATIC FUNCTION PANEL
ALT: 37 U/L (ref 0–53)
AST: 40 U/L — ABNORMAL HIGH (ref 0–37)
Bilirubin, Direct: 0.2 mg/dL (ref 0.0–0.3)
Total Protein: 7.1 g/dL (ref 6.0–8.3)

## 2011-05-31 LAB — BASIC METABOLIC PANEL
BUN: 14 mg/dL (ref 6–23)
Calcium: 9.4 mg/dL (ref 8.4–10.5)
Creatinine, Ser: 1.1 mg/dL (ref 0.4–1.5)
GFR: 67.14 mL/min (ref >60.00)
Potassium: 3.8 mEq/L (ref 3.5–5.1)

## 2011-05-31 LAB — BRAIN NATRIURETIC PEPTIDE: Pro B Natriuretic peptide (BNP): 24 pg/mL (ref 0.0–100.0)

## 2011-05-31 NOTE — Assessment & Plan Note (Signed)
Mild to moderate AS on last echo in 9/11.  No symptoms referrable to AS.  Would repeat echo in 1-2 years depending on symptoms.

## 2011-05-31 NOTE — Assessment & Plan Note (Signed)
1 documented episode several years ago associated with use of a stimulant diet pill.  He has avoided these since.  He has not tolerated beta blockers due hallucinations.  No recent symptoms consistent with atrial fibrillation.  Continue ASA.  If has recurrence, will need anticoagulation.

## 2011-05-31 NOTE — Progress Notes (Signed)
PCP:  Dr. Ivory Broad in [place]  77 yo with history of paroxysmal atrial fibrillation and mild to moderate AS returns for cardiology followup.  Patient has seen Dr. Juanda Chance in the past and is seeing me for the first time today.  He had an episode of atrial fibrillation in 2007 or 2008 after taking a diet pill.  He has not had a documented recurrence.  He has been on aspirin but not coumadin.  He denies tachypalpitations.  He is in sinus rhythm today.  Last echo showed mild AS by mean gradient though visually AS appeared more moderate.  Patient is active in general.  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  No chest pain, no exertional dyspnea.  No syncope/presyncope.    ECG: NSR, iRBBB, LAFB  Labs (3/11): creatinine 1.1  PMH: 1. Paroxysmal atrial fibrillation: 1 episode in 2007 or 2008 after taking a diet pill.  Has not had a documented recurrence.  He is not on coumadin.  He had hallucinations with use of beta blocker.  2. Aortic stenosis: Mild to moderate.  Echo (9/11) with EF 55%, mean aortic valve gradient 16 mmHg but visually AS appeared moderate.   3. S/p cholecystectomy 4. Left heart cath in 2009 showed normal coronaries.   SH: Lives with wife in [place].  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  Nonsmoker.   FH: Father died in car accident.  Mother had asthma.   ROS: All systems reviewed and negative except as per HPI.   Current Outpatient Prescriptions  Medication Sig Dispense Refill  . aspirin 81 MG tablet Take 81 mg by mouth daily.        . MULTIPLE VITAMIN PO Take 1 tablet by mouth daily.        . NON FORMULARY zylamend 1 capsule daily       . Omega-3 Fatty Acids (FISH OIL CONCENTRATE PO) Take by mouth. 1 teaspoon          BP 154/92  Pulse 77  Ht 6\' 4"  (1.93 m)  Wt 98.793 kg (217 lb 12.8 oz)  BMI 26.51 kg/m2 General: NAD, overweight Neck: No JVD, no thyromegaly or thyroid nodule.  Lungs: Clear to auscultation bilaterally with normal respiratory  effort. CV: Nondisplaced PMI.  Heart regular S1/S2, no S3/S4, 2/6 early to mid-peaking systolic crescendo-decrescendo murmur RUSB.  No peripheral edema.  No carotid bruit.  Normal pedal pulses.  Abdomen: Soft, nontender, no hepatosplenomegaly, no distention.  Neurologic: Alert and oriented x 3.  Psych: Normal affect. Extremities: No clubbing or cyanosis.

## 2011-05-31 NOTE — Assessment & Plan Note (Addendum)
BP is high today but has not been high when he checks at home.  He will check daily and record results for 2 weeks, we will call at that time to see what BP runs at home.   Check lipids today.

## 2011-05-31 NOTE — Patient Instructions (Addendum)
Your physician recommends that you have a FASTING lipid profile /liver profile/CBC/BMET/BNP 427.31  424.1  Your physician recommends that you schedule an appointment to establish with Dr  Marguarite Arbour at [facility] in [facility] for your primary care doctor.  Take and record your blood pressure about 2 hours after you take your medication. I will call you in 2 weeks to get the readings. [name] [phone]  Your physician wants you to follow-up in: 1 year with Dr Shirlee Latch. (November 2013).You will receive a reminder letter in the mail two months in advance. If you don't receive a letter, please call our office to schedule the follow-up appointment.

## 2011-06-01 ENCOUNTER — Telehealth: Payer: Self-pay | Admitting: Cardiology

## 2011-06-01 NOTE — Telephone Encounter (Signed)
FU Call: Pt wife returning call to [facility] to get results of pt lab work. Please return pt wife call to discuss further.

## 2011-06-01 NOTE — Telephone Encounter (Signed)
I talked with pt's wife about recent lab results

## 2011-06-02 ENCOUNTER — Ambulatory Visit: Payer: Self-pay | Admitting: Cardiology

## 2011-06-16 ENCOUNTER — Telehealth: Payer: Self-pay | Admitting: *Deleted

## 2011-06-16 NOTE — Telephone Encounter (Signed)
HYPERTENSION, BENIGN - Marca Ancona, MD 05/31/2011 1:38 PM Addendum  BP is high today but has not been high when he checks at home. He will check daily and record results for 2 weeks, we will call at that time to see what BP runs at home.   06/16/11-LMTCB for pt to get recent BP readings.

## 2011-06-18 NOTE — Telephone Encounter (Signed)
I talked with pt. Pt is  out of town and does not have BP readings with him. He will call me back this afternoon or Monday with the BP readings.

## 2011-06-21 NOTE — Telephone Encounter (Signed)
BPs are good.  No changes.

## 2011-06-21 NOTE — Telephone Encounter (Signed)
Discussed with pt

## 2011-06-21 NOTE — Telephone Encounter (Signed)
Pt spouse rtning your call/lg

## 2011-06-21 NOTE — Telephone Encounter (Signed)
Per pt's wife--recent BP readings--06/01/11 129/87  06/02/11 133/78  06/04/11 133/75  06/07/11 119/72  06/08/11 129/72  06/09/11 136/73  06/10/11 122/79  06/11/11 139/75 --pt left for fishing trip to [place] with his granddaughter-no more readings. I will forward to Dr Shirlee Latch for review.

## 2011-07-27 ENCOUNTER — Encounter: Payer: Self-pay | Admitting: Internal Medicine

## 2011-07-27 ENCOUNTER — Ambulatory Visit (INDEPENDENT_AMBULATORY_CARE_PROVIDER_SITE_OTHER): Payer: Medicare Other | Admitting: Internal Medicine

## 2011-07-27 DIAGNOSIS — R945 Abnormal results of liver function studies: Secondary | ICD-10-CM

## 2011-07-27 DIAGNOSIS — IMO0002 Reserved for concepts with insufficient information to code with codable children: Secondary | ICD-10-CM

## 2011-07-27 DIAGNOSIS — R739 Hyperglycemia, unspecified: Secondary | ICD-10-CM

## 2011-07-27 DIAGNOSIS — R7989 Other specified abnormal findings of blood chemistry: Secondary | ICD-10-CM

## 2011-07-27 DIAGNOSIS — R7309 Other abnormal glucose: Secondary | ICD-10-CM | POA: Diagnosis not present

## 2011-07-27 DIAGNOSIS — M6740 Ganglion, unspecified site: Secondary | ICD-10-CM | POA: Diagnosis not present

## 2011-07-27 LAB — HEPATIC FUNCTION PANEL
Alkaline Phosphatase: 151 U/L — ABNORMAL HIGH (ref 39–117)
Indirect Bilirubin: 0.6 mg/dL (ref 0.0–0.9)
Total Bilirubin: 0.8 mg/dL (ref 0.3–1.2)

## 2011-07-27 LAB — HEMOGLOBIN A1C: Mean Plasma Glucose: 111 mg/dL (ref <117)

## 2011-07-31 DIAGNOSIS — IMO0002 Reserved for concepts with insufficient information to code with codable children: Secondary | ICD-10-CM | POA: Insufficient documentation

## 2011-07-31 DIAGNOSIS — R7989 Other specified abnormal findings of blood chemistry: Secondary | ICD-10-CM | POA: Insufficient documentation

## 2011-07-31 DIAGNOSIS — R945 Abnormal results of liver function studies: Secondary | ICD-10-CM | POA: Insufficient documentation

## 2011-07-31 DIAGNOSIS — R739 Hyperglycemia, unspecified: Secondary | ICD-10-CM | POA: Insufficient documentation

## 2011-07-31 NOTE — Assessment & Plan Note (Signed)
Asx. Hand surgery consult if becomes symptomatic

## 2011-07-31 NOTE — Progress Notes (Signed)
  Subjective:    Patient ID: Gavin Lane, male    DOB: September 23, 1929, 78 y.o.   MRN: [id number]  HPI Pt presents to clinic to establish care and for evaluation of multiple medical problems. H/o AAA with reported multiple f/u US's negative for aneurysm. Left palm has asx cyst without pain.  H/o PAF and mod AS without sx's followed by cardiology. BP minimally elevated but nl on home monitoring. No active complaint.  Past Medical History  Diagnosis Date  . Aortic valve disorders   . Paroxysmal supraventricular tachycardia   . Atrial fibrillation   . Heart failure, unspecified   . AAA (abdominal aortic aneurysm)   . History of shingles    Past Surgical History  Procedure Date  . Cholecystectomy 2005  . Inguinal hernia repair over 30 years ago  . Cystoscopy 1945    kidney stones    reports that he has never smoked. He has never used smokeless tobacco. He reports that he does not drink alcohol or use illicit drugs. family history includes Asthma in his mother.  There is no history of Colon cancer, and Breast cancer, and Diabetes, and Prostate cancer, and Hypertension, and Heart disease, . No Known Allergies   Review of Systems  Respiratory: Negative for shortness of breath.   Cardiovascular: Negative for chest pain.  All other systems reviewed and are negative.       Objective:   Physical Exam  Nursing note and vitals reviewed. Constitutional: He appears well-developed and well-nourished. No distress.  HENT:  Head: Normocephalic and atraumatic.  Right Ear: External ear normal.  Left Ear: External ear normal.  Eyes: Conjunctivae are normal. No scleral icterus.  Neck: Neck supple.  Cardiovascular: Normal rate and regular rhythm.   Murmur heard. Pulmonary/Chest: Effort normal and breath sounds normal. No respiratory distress. He has no wheezes. He has no rales.  Neurological: He is alert.  Skin: Skin is warm and dry. He is not diaphoretic.       Well circumscribed ST mass of  left palm. Mobile and nt.  Psychiatric: He has a normal mood and affect.          Assessment & Plan:

## 2011-07-31 NOTE — Assessment & Plan Note (Signed)
Obtain a1c.  

## 2011-07-31 NOTE — Assessment & Plan Note (Signed)
Repeat lft

## 2011-08-02 ENCOUNTER — Other Ambulatory Visit: Payer: Self-pay | Admitting: Internal Medicine

## 2011-08-02 DIAGNOSIS — R945 Abnormal results of liver function studies: Secondary | ICD-10-CM

## 2011-09-14 DIAGNOSIS — M549 Dorsalgia, unspecified: Secondary | ICD-10-CM | POA: Diagnosis not present

## 2011-09-14 DIAGNOSIS — Z0000 Encounter for general adult medical examination without abnormal findings: Secondary | ICD-10-CM | POA: Diagnosis not present

## 2011-09-14 DIAGNOSIS — Z125 Encounter for screening for malignant neoplasm of prostate: Secondary | ICD-10-CM | POA: Diagnosis not present

## 2011-09-15 DIAGNOSIS — H6120 Impacted cerumen, unspecified ear: Secondary | ICD-10-CM | POA: Diagnosis not present

## 2011-11-17 DIAGNOSIS — Z85828 Personal history of other malignant neoplasm of skin: Secondary | ICD-10-CM | POA: Diagnosis not present

## 2011-11-17 DIAGNOSIS — L570 Actinic keratosis: Secondary | ICD-10-CM | POA: Diagnosis not present

## 2012-03-18 DIAGNOSIS — E569 Vitamin deficiency, unspecified: Secondary | ICD-10-CM | POA: Diagnosis not present

## 2012-03-18 DIAGNOSIS — E559 Vitamin D deficiency, unspecified: Secondary | ICD-10-CM | POA: Diagnosis not present

## 2012-03-18 DIAGNOSIS — R748 Abnormal levels of other serum enzymes: Secondary | ICD-10-CM | POA: Diagnosis not present

## 2012-04-24 DIAGNOSIS — Z85828 Personal history of other malignant neoplasm of skin: Secondary | ICD-10-CM | POA: Diagnosis not present

## 2012-04-24 DIAGNOSIS — L570 Actinic keratosis: Secondary | ICD-10-CM | POA: Diagnosis not present

## 2012-05-17 DIAGNOSIS — Z23 Encounter for immunization: Secondary | ICD-10-CM | POA: Diagnosis not present

## 2012-10-23 DIAGNOSIS — L570 Actinic keratosis: Secondary | ICD-10-CM | POA: Diagnosis not present

## 2012-10-23 DIAGNOSIS — L408 Other psoriasis: Secondary | ICD-10-CM | POA: Diagnosis not present

## 2012-10-23 DIAGNOSIS — Z85828 Personal history of other malignant neoplasm of skin: Secondary | ICD-10-CM | POA: Diagnosis not present

## 2013-01-03 DIAGNOSIS — D0439 Carcinoma in situ of skin of other parts of face: Secondary | ICD-10-CM | POA: Diagnosis not present

## 2013-04-23 DIAGNOSIS — Z85828 Personal history of other malignant neoplasm of skin: Secondary | ICD-10-CM | POA: Diagnosis not present

## 2013-04-23 DIAGNOSIS — L570 Actinic keratosis: Secondary | ICD-10-CM | POA: Diagnosis not present

## 2013-04-24 DIAGNOSIS — IMO0002 Reserved for concepts with insufficient information to code with codable children: Secondary | ICD-10-CM | POA: Diagnosis not present

## 2013-04-24 DIAGNOSIS — M999 Biomechanical lesion, unspecified: Secondary | ICD-10-CM | POA: Diagnosis not present

## 2013-04-24 DIAGNOSIS — M238X9 Other internal derangements of unspecified knee: Secondary | ICD-10-CM | POA: Diagnosis not present

## 2013-04-25 DIAGNOSIS — M999 Biomechanical lesion, unspecified: Secondary | ICD-10-CM | POA: Diagnosis not present

## 2013-04-25 DIAGNOSIS — M238X9 Other internal derangements of unspecified knee: Secondary | ICD-10-CM | POA: Diagnosis not present

## 2013-04-25 DIAGNOSIS — IMO0002 Reserved for concepts with insufficient information to code with codable children: Secondary | ICD-10-CM | POA: Diagnosis not present

## 2013-04-26 DIAGNOSIS — M238X9 Other internal derangements of unspecified knee: Secondary | ICD-10-CM | POA: Diagnosis not present

## 2013-04-26 DIAGNOSIS — IMO0002 Reserved for concepts with insufficient information to code with codable children: Secondary | ICD-10-CM | POA: Diagnosis not present

## 2013-04-26 DIAGNOSIS — M999 Biomechanical lesion, unspecified: Secondary | ICD-10-CM | POA: Diagnosis not present

## 2013-04-30 DIAGNOSIS — M238X9 Other internal derangements of unspecified knee: Secondary | ICD-10-CM | POA: Diagnosis not present

## 2013-04-30 DIAGNOSIS — M999 Biomechanical lesion, unspecified: Secondary | ICD-10-CM | POA: Diagnosis not present

## 2013-04-30 DIAGNOSIS — IMO0002 Reserved for concepts with insufficient information to code with codable children: Secondary | ICD-10-CM | POA: Diagnosis not present

## 2013-12-14 ENCOUNTER — Telehealth: Payer: Self-pay | Admitting: Cardiology

## 2013-12-14 DIAGNOSIS — I4891 Unspecified atrial fibrillation: Secondary | ICD-10-CM

## 2013-12-14 DIAGNOSIS — I359 Nonrheumatic aortic valve disorder, unspecified: Secondary | ICD-10-CM

## 2013-12-14 DIAGNOSIS — I10 Essential (primary) hypertension: Secondary | ICD-10-CM

## 2013-12-14 NOTE — Telephone Encounter (Signed)
New Message:  Pt's wife is asking if Mr Gavin Lane needs fasting blood work prior to his appt. There are no orders in Epic. Mrs. Gavin Lane is requesting to speak w/ Thurston HoleAnne.

## 2013-12-14 NOTE — Telephone Encounter (Signed)
Spoke with patient's wife, last lab anywhere was September 2013. Pt has appt with Dr Shirlee LatchMcLean in July and is asking if any lab should be done prior to that appt. I will forward to Dr Shirlee LatchMcLean for review.

## 2013-12-17 NOTE — Telephone Encounter (Signed)
He needs lipids, CMET, CBC

## 2013-12-17 NOTE — Telephone Encounter (Signed)
I have put orders in Epic for labs that Dr Shirlee LatchMcLean is ordering. Please call pt to schedule FASTING  lab appt for this a few days before his July appt with Dr Shirlee LatchMcLean. Thanks.

## 2014-01-21 ENCOUNTER — Other Ambulatory Visit (INDEPENDENT_AMBULATORY_CARE_PROVIDER_SITE_OTHER): Payer: Medicare Other

## 2014-01-21 DIAGNOSIS — I10 Essential (primary) hypertension: Secondary | ICD-10-CM

## 2014-01-21 DIAGNOSIS — I4891 Unspecified atrial fibrillation: Secondary | ICD-10-CM

## 2014-01-21 DIAGNOSIS — I359 Nonrheumatic aortic valve disorder, unspecified: Secondary | ICD-10-CM | POA: Diagnosis not present

## 2014-01-21 LAB — LIPID PANEL
CHOL/HDL RATIO: 4
Cholesterol: 122 mg/dL (ref 0–200)
HDL: 33.2 mg/dL — AB (ref >39.00)
LDL Cholesterol: 40 mg/dL (ref 0–99)
NONHDL: 88.8
Triglycerides: 243 mg/dL — ABNORMAL HIGH (ref 0.0–149.0)
VLDL: 48.6 mg/dL — ABNORMAL HIGH (ref 0.0–40.0)

## 2014-01-21 LAB — CBC WITH DIFFERENTIAL/PLATELET
BASOS PCT: 0.4 % (ref 0.0–3.0)
Basophils Absolute: 0 10*3/uL (ref 0.0–0.1)
EOS ABS: 0.2 10*3/uL (ref 0.0–0.7)
Eosinophils Relative: 2.4 % (ref 0.0–5.0)
HCT: 48.7 % (ref 39.0–52.0)
HEMOGLOBIN: 16.6 g/dL (ref 13.0–17.0)
LYMPHS ABS: 2.9 10*3/uL (ref 0.7–4.0)
Lymphocytes Relative: 30.7 % (ref 12.0–46.0)
MCHC: 34 g/dL (ref 30.0–36.0)
MCV: 92.7 fl (ref 78.0–100.0)
MONO ABS: 0.9 10*3/uL (ref 0.1–1.0)
Monocytes Relative: 9.9 % (ref 3.0–12.0)
NEUTROS ABS: 5.4 10*3/uL (ref 1.4–7.7)
NEUTROS PCT: 56.6 % (ref 43.0–77.0)
Platelets: 204 10*3/uL (ref 150.0–400.0)
RBC: 5.26 Mil/uL (ref 4.22–5.81)
RDW: 13.2 % (ref 11.5–15.5)
WBC: 9.5 10*3/uL (ref 4.0–10.5)

## 2014-01-21 LAB — HEPATIC FUNCTION PANEL
ALT: 32 U/L (ref 0–53)
AST: 44 U/L — ABNORMAL HIGH (ref 0–37)
Albumin: 3.9 g/dL (ref 3.5–5.2)
Alkaline Phosphatase: 119 U/L — ABNORMAL HIGH (ref 39–117)
BILIRUBIN DIRECT: 0 mg/dL (ref 0.0–0.3)
BILIRUBIN TOTAL: 1.1 mg/dL (ref 0.2–1.2)
Total Protein: 7.2 g/dL (ref 6.0–8.3)

## 2014-01-21 LAB — BASIC METABOLIC PANEL
BUN: 19 mg/dL (ref 6–23)
CHLORIDE: 106 meq/L (ref 96–112)
CO2: 23 mEq/L (ref 19–32)
CREATININE: 1.2 mg/dL (ref 0.4–1.5)
Calcium: 9.3 mg/dL (ref 8.4–10.5)
GFR: 60.42 mL/min (ref >60.00)
Glucose, Bld: 103 mg/dL — ABNORMAL HIGH (ref 70–99)
Potassium: 4 mEq/L (ref 3.5–5.1)
Sodium: 139 mEq/L (ref 135–145)

## 2014-01-24 ENCOUNTER — Encounter: Payer: Self-pay | Admitting: Cardiology

## 2014-01-24 ENCOUNTER — Ambulatory Visit (INDEPENDENT_AMBULATORY_CARE_PROVIDER_SITE_OTHER): Payer: Medicare Other | Admitting: Cardiology

## 2014-01-24 VITALS — BP 138/88 | HR 87 | Ht 76.0 in | Wt 219.0 lb

## 2014-01-24 DIAGNOSIS — I480 Paroxysmal atrial fibrillation: Secondary | ICD-10-CM

## 2014-01-24 DIAGNOSIS — I359 Nonrheumatic aortic valve disorder, unspecified: Secondary | ICD-10-CM | POA: Diagnosis not present

## 2014-01-24 DIAGNOSIS — I350 Nonrheumatic aortic (valve) stenosis: Secondary | ICD-10-CM

## 2014-01-24 DIAGNOSIS — I4891 Unspecified atrial fibrillation: Secondary | ICD-10-CM

## 2014-01-24 NOTE — Patient Instructions (Signed)
Your physician has requested that you have an echocardiogram. Echocardiography is a painless test that uses sound waves to create images of your heart. It provides your doctor with information about the size and shape of your heart and how well your heart's chambers and valves are working. This procedure takes approximately one hour. There are no restrictions for this procedure.  Your physician wants you to follow-up in: 1 year with Dr Shirlee LatchMcLean. (July 2016). You will receive a reminder letter in the mail two months in advance. If you don't receive a letter, please call our office to schedule the follow-up appointment.

## 2014-01-27 NOTE — Progress Notes (Signed)
Patient ID: Gavin DickerKenneth D Rollo, male   DOB: 11/12/1929, 80 y.o.   MRN: [id number] PCP:  None currently  80 yo with history of paroxysmal atrial fibrillation and mild to moderate AS returns for cardiology followup.  I have not seen him in a couple of years.  He had an episode of atrial fibrillation in 2007 or 2008 after taking a diet pill.  He has not had a documented recurrence since.  He has been on aspirin but not coumadin.  He denies tachypalpitations.  He is in sinus rhythm today.  Last echo showed mild AS by mean gradient though visually AS appeared more moderate (this was in 2011).  Patient is active in general.  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  No chest pain, no exertional dyspnea.  No syncope/presyncope.  BP has been controlled when he checks at home.   ECG: NSR, iRBBB, LAFB  Labs (3/11): creatinine 1.1 Labs (7/15): K 4, creatinine 1.2, LDL 40, HDL 33, AST 44, ALT 32  PMH: 1. Paroxysmal atrial fibrillation: 1 episode in 2007 or 2008 after taking a diet pill.  Has not had a documented recurrence.  He is not on coumadin.  He had hallucinations with use of beta blocker.  2. Aortic stenosis: Mild to moderate.  Echo (9/11) with EF 55%, mean aortic valve gradient 16 mmHg but visually AS appeared moderate.   3. S/p cholecystectomy 4. Left heart cath in 2009 showed normal coronaries.  5. Mild transaminitis  SH: [place].  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   FH: Father died in car accident.  Mother had asthma.   ROS: All systems reviewed and negative except as per HPI.   Current Outpatient Prescriptions  Medication Sig Dispense Refill  . aspirin 81 MG tablet Take 81 mg by mouth daily.        . DHA-EPA-Vit B6-B12-Folic Acid (CARDIOVID PLUS PO) Take by mouth daily.      . Flaxseed, Linseed, (FLAXSEED OIL PO) Take by mouth.      . MULTIPLE VITAMIN PO Take 1 tablet by mouth daily.        . NON FORMULARY Take by mouth daily. Prostate 5LX        No current facility-administered medications for this visit.    BP 138/88  Pulse 87  Ht 6\' 4"  (1.93 m)  Wt 219 lb (99.338 kg)  BMI 26.67 kg/m2 General: NAD, overweight Neck: No JVD, no thyromegaly or thyroid nodule.  Lungs: Clear to auscultation bilaterally with normal respiratory effort. CV: Nondisplaced PMI.  Heart regular S1/S2, no S3/S4, 3/6 early to mid-peaking systolic crescendo-decrescendo murmur RUSB, S2 clearly heard.  No peripheral edema.  No carotid bruit.  Normal pedal pulses.  Abdomen: Soft, nontender, no hepatosplenomegaly, no distention.  Neurologic: Alert and oriented x 3.  Psych: Normal affect. Extremities: No clubbing or cyanosis.   Assessment/Plan: 1. Aortic stenosis: Mild to moderate by 2011 echo.  Significant murmur on exam today but does not have symptoms suggestive of severe aortic stenosis.  I will get an echo to assess for progression of AS.  2. Hyperlipidemia: Excellent LDL when checked this month. 3. Paroxysmal atrial fibrillation: Only 1 episode has been documented (related to use of diet pills).  This was a number of years ago.  If he has a documented recurrence, should be anticoagulated.  For now, will take ASA 81 mg daily.   Marca AnconaDalton Shayaan Parke 01/27/2014

## 2014-02-06 ENCOUNTER — Ambulatory Visit (HOSPITAL_COMMUNITY): Payer: Medicare Other | Attending: Cardiology | Admitting: Radiology

## 2014-02-06 DIAGNOSIS — I359 Nonrheumatic aortic valve disorder, unspecified: Secondary | ICD-10-CM | POA: Insufficient documentation

## 2014-02-06 DIAGNOSIS — I350 Nonrheumatic aortic (valve) stenosis: Secondary | ICD-10-CM

## 2014-02-06 NOTE — Progress Notes (Signed)
Echocardiogram performed.  

## 2014-02-13 ENCOUNTER — Telehealth: Payer: Self-pay | Admitting: Cardiology

## 2014-02-13 NOTE — Telephone Encounter (Signed)
New message ° ° ° ° ° °Want echo results °

## 2014-02-13 NOTE — Telephone Encounter (Signed)
lmtcb Debbie Teva Bronkema RN  

## 2014-02-18 NOTE — Telephone Encounter (Signed)
Spoke with patient's wife about echo results.

## 2014-05-13 DIAGNOSIS — Z08 Encounter for follow-up examination after completed treatment for malignant neoplasm: Secondary | ICD-10-CM | POA: Diagnosis not present

## 2014-05-13 DIAGNOSIS — Z85828 Personal history of other malignant neoplasm of skin: Secondary | ICD-10-CM | POA: Diagnosis not present

## 2014-05-13 DIAGNOSIS — L570 Actinic keratosis: Secondary | ICD-10-CM | POA: Diagnosis not present

## 2014-06-11 DIAGNOSIS — M9901 Segmental and somatic dysfunction of cervical region: Secondary | ICD-10-CM | POA: Diagnosis not present

## 2014-06-11 DIAGNOSIS — M238X1 Other internal derangements of right knee: Secondary | ICD-10-CM | POA: Diagnosis not present

## 2014-06-11 DIAGNOSIS — M9903 Segmental and somatic dysfunction of lumbar region: Secondary | ICD-10-CM | POA: Diagnosis not present

## 2014-06-11 DIAGNOSIS — M5416 Radiculopathy, lumbar region: Secondary | ICD-10-CM | POA: Diagnosis not present

## 2014-06-11 DIAGNOSIS — M238X2 Other internal derangements of left knee: Secondary | ICD-10-CM | POA: Diagnosis not present

## 2014-06-11 DIAGNOSIS — M792 Neuralgia and neuritis, unspecified: Secondary | ICD-10-CM | POA: Diagnosis not present

## 2014-06-11 DIAGNOSIS — M9902 Segmental and somatic dysfunction of thoracic region: Secondary | ICD-10-CM | POA: Diagnosis not present

## 2014-06-11 DIAGNOSIS — M609 Myositis, unspecified: Secondary | ICD-10-CM | POA: Diagnosis not present

## 2014-06-13 DIAGNOSIS — M792 Neuralgia and neuritis, unspecified: Secondary | ICD-10-CM | POA: Diagnosis not present

## 2014-06-13 DIAGNOSIS — M9902 Segmental and somatic dysfunction of thoracic region: Secondary | ICD-10-CM | POA: Diagnosis not present

## 2014-06-13 DIAGNOSIS — M9903 Segmental and somatic dysfunction of lumbar region: Secondary | ICD-10-CM | POA: Diagnosis not present

## 2014-06-13 DIAGNOSIS — M238X1 Other internal derangements of right knee: Secondary | ICD-10-CM | POA: Diagnosis not present

## 2014-06-13 DIAGNOSIS — M9901 Segmental and somatic dysfunction of cervical region: Secondary | ICD-10-CM | POA: Diagnosis not present

## 2014-06-13 DIAGNOSIS — M5416 Radiculopathy, lumbar region: Secondary | ICD-10-CM | POA: Diagnosis not present

## 2014-06-13 DIAGNOSIS — M238X2 Other internal derangements of left knee: Secondary | ICD-10-CM | POA: Diagnosis not present

## 2014-06-13 DIAGNOSIS — M609 Myositis, unspecified: Secondary | ICD-10-CM | POA: Diagnosis not present

## 2014-06-17 DIAGNOSIS — M9901 Segmental and somatic dysfunction of cervical region: Secondary | ICD-10-CM | POA: Diagnosis not present

## 2014-06-17 DIAGNOSIS — M792 Neuralgia and neuritis, unspecified: Secondary | ICD-10-CM | POA: Diagnosis not present

## 2014-06-17 DIAGNOSIS — M9903 Segmental and somatic dysfunction of lumbar region: Secondary | ICD-10-CM | POA: Diagnosis not present

## 2014-06-17 DIAGNOSIS — M238X2 Other internal derangements of left knee: Secondary | ICD-10-CM | POA: Diagnosis not present

## 2014-06-17 DIAGNOSIS — M5416 Radiculopathy, lumbar region: Secondary | ICD-10-CM | POA: Diagnosis not present

## 2014-06-17 DIAGNOSIS — M609 Myositis, unspecified: Secondary | ICD-10-CM | POA: Diagnosis not present

## 2014-06-17 DIAGNOSIS — M9902 Segmental and somatic dysfunction of thoracic region: Secondary | ICD-10-CM | POA: Diagnosis not present

## 2014-06-17 DIAGNOSIS — M238X1 Other internal derangements of right knee: Secondary | ICD-10-CM | POA: Diagnosis not present

## 2014-06-19 DIAGNOSIS — M9901 Segmental and somatic dysfunction of cervical region: Secondary | ICD-10-CM | POA: Diagnosis not present

## 2014-06-19 DIAGNOSIS — M238X1 Other internal derangements of right knee: Secondary | ICD-10-CM | POA: Diagnosis not present

## 2014-06-19 DIAGNOSIS — M9902 Segmental and somatic dysfunction of thoracic region: Secondary | ICD-10-CM | POA: Diagnosis not present

## 2014-06-19 DIAGNOSIS — M609 Myositis, unspecified: Secondary | ICD-10-CM | POA: Diagnosis not present

## 2014-06-19 DIAGNOSIS — M238X2 Other internal derangements of left knee: Secondary | ICD-10-CM | POA: Diagnosis not present

## 2014-06-19 DIAGNOSIS — M5416 Radiculopathy, lumbar region: Secondary | ICD-10-CM | POA: Diagnosis not present

## 2014-06-19 DIAGNOSIS — M9903 Segmental and somatic dysfunction of lumbar region: Secondary | ICD-10-CM | POA: Diagnosis not present

## 2014-06-19 DIAGNOSIS — M792 Neuralgia and neuritis, unspecified: Secondary | ICD-10-CM | POA: Diagnosis not present

## 2014-07-04 DIAGNOSIS — M609 Myositis, unspecified: Secondary | ICD-10-CM | POA: Diagnosis not present

## 2014-07-04 DIAGNOSIS — M238X2 Other internal derangements of left knee: Secondary | ICD-10-CM | POA: Diagnosis not present

## 2014-07-04 DIAGNOSIS — M5416 Radiculopathy, lumbar region: Secondary | ICD-10-CM | POA: Diagnosis not present

## 2014-07-04 DIAGNOSIS — M9902 Segmental and somatic dysfunction of thoracic region: Secondary | ICD-10-CM | POA: Diagnosis not present

## 2014-07-04 DIAGNOSIS — M792 Neuralgia and neuritis, unspecified: Secondary | ICD-10-CM | POA: Diagnosis not present

## 2014-07-04 DIAGNOSIS — M9901 Segmental and somatic dysfunction of cervical region: Secondary | ICD-10-CM | POA: Diagnosis not present

## 2014-07-04 DIAGNOSIS — M238X1 Other internal derangements of right knee: Secondary | ICD-10-CM | POA: Diagnosis not present

## 2014-07-04 DIAGNOSIS — M9903 Segmental and somatic dysfunction of lumbar region: Secondary | ICD-10-CM | POA: Diagnosis not present

## 2015-05-19 DIAGNOSIS — Z85828 Personal history of other malignant neoplasm of skin: Secondary | ICD-10-CM | POA: Diagnosis not present

## 2015-05-19 DIAGNOSIS — Z08 Encounter for follow-up examination after completed treatment for malignant neoplasm: Secondary | ICD-10-CM | POA: Diagnosis not present

## 2015-05-19 DIAGNOSIS — L570 Actinic keratosis: Secondary | ICD-10-CM | POA: Diagnosis not present

## 2015-06-09 DIAGNOSIS — Z23 Encounter for immunization: Secondary | ICD-10-CM | POA: Diagnosis not present

## 2015-08-26 ENCOUNTER — Telehealth: Payer: Self-pay | Admitting: Cardiology

## 2015-08-26 NOTE — Telephone Encounter (Signed)
Returning your call. °

## 2015-08-26 NOTE — Telephone Encounter (Signed)
New message  Pt wife called request the pt to have labs completed/ Please put in orders.

## 2015-08-26 NOTE — Telephone Encounter (Signed)
Left pt a message to call back. 

## 2015-08-27 NOTE — Telephone Encounter (Signed)
Pt's wife called requesting for pt to have labs, and for nurse to place orders in Epic. Pt has not been in the office since 01/24/14. Pt's wife was made aware that since pt has not been in this office since 2015 , he needs to see the cardiologist prior ordering labs. Pt has an appointment with Dr. Shirlee Latch on 12/03/15 at 2:15 PM. Pt's wife verbalized understanding.

## 2015-12-03 ENCOUNTER — Encounter: Payer: Self-pay | Admitting: Cardiology

## 2015-12-03 ENCOUNTER — Ambulatory Visit (INDEPENDENT_AMBULATORY_CARE_PROVIDER_SITE_OTHER): Payer: Medicare Other | Admitting: Cardiology

## 2015-12-03 VITALS — BP 152/82 | HR 84 | Ht 76.0 in | Wt 210.6 lb

## 2015-12-03 DIAGNOSIS — I10 Essential (primary) hypertension: Secondary | ICD-10-CM

## 2015-12-03 DIAGNOSIS — I350 Nonrheumatic aortic (valve) stenosis: Secondary | ICD-10-CM | POA: Insufficient documentation

## 2015-12-03 DIAGNOSIS — I480 Paroxysmal atrial fibrillation: Secondary | ICD-10-CM

## 2015-12-03 DIAGNOSIS — I4891 Unspecified atrial fibrillation: Secondary | ICD-10-CM | POA: Diagnosis not present

## 2015-12-03 DIAGNOSIS — I359 Nonrheumatic aortic valve disorder, unspecified: Secondary | ICD-10-CM | POA: Diagnosis not present

## 2015-12-03 LAB — LIPID PANEL
CHOL/HDL RATIO: 3.6 ratio (ref <=5.0)
CHOLESTEROL: 126 mg/dL (ref 125–200)
HDL: 35 mg/dL — AB (ref >=40)
LDL Cholesterol: 33 mg/dL (ref <130)
TRIGLYCERIDES: 289 mg/dL — AB (ref <150)
VLDL: 58 mg/dL — ABNORMAL HIGH (ref <30)

## 2015-12-03 LAB — BASIC METABOLIC PANEL
BUN: 15 mg/dL (ref 7–25)
CALCIUM: 9.4 mg/dL (ref 8.6–10.3)
CO2: 22 mmol/L (ref 20–31)
CREATININE: 1.14 mg/dL — AB (ref 0.70–1.11)
Chloride: 106 mmol/L (ref 98–110)
GLUCOSE: 95 mg/dL (ref 65–99)
Potassium: 4.1 mmol/L (ref 3.5–5.3)
Sodium: 140 mmol/L (ref 135–146)

## 2015-12-03 LAB — CBC WITH DIFFERENTIAL/PLATELET
BASOS ABS: 0 {cells}/uL (ref 0–200)
Basophils Relative: 0 %
EOS PCT: 2 %
Eosinophils Absolute: 226 cells/uL (ref 15–500)
HCT: 46.5 % (ref 38.5–50.0)
Hemoglobin: 16.2 g/dL (ref 13.2–17.1)
LYMPHS PCT: 30 %
Lymphs Abs: 3390 cells/uL (ref 850–3900)
MCH: 31.5 pg (ref 27.0–33.0)
MCHC: 34.8 g/dL (ref 32.0–36.0)
MCV: 90.5 fL (ref 80.0–100.0)
MPV: 11.1 fL (ref 7.5–12.5)
Monocytes Absolute: 1130 cells/uL — ABNORMAL HIGH (ref 200–950)
Monocytes Relative: 10 %
NEUTROS PCT: 58 %
Neutro Abs: 6554 cells/uL (ref 1500–7800)
Platelets: 225 10*3/uL (ref 140–400)
RBC: 5.14 MIL/uL (ref 4.20–5.80)
RDW: 13.3 % (ref 11.0–15.0)
WBC: 11.3 10*3/uL — AB (ref 3.8–10.8)

## 2015-12-03 LAB — TSH: TSH: 3.53 m[IU]/L (ref 0.40–4.50)

## 2015-12-03 NOTE — Progress Notes (Signed)
Patient ID: Gavin DickerKenneth D Macfarlane, male   DOB: 12/26/1929, 82 y.o.   MRN: [id number] PCP:  None currently  82 yo with history of paroxysmal atrial fibrillation and moderate AS returns for cardiology followup.  I have not seen him in a couple of years.  He had an episode of atrial fibrillation in 2007 or 2008 after taking a diet pill.  He has not had a documented recurrence since.  He has been on aspirin but not coumadin.  He denies tachypalpitations.  He is in sinus rhythm today.  Patient is active in general. He walks daily for a mile.  No chest pain, no exertional dyspnea.  No syncope/presyncope.  BP has been controlled when he checks at home, SBP 120s-130s.     ECG: NSR, PVCs, poor RWP, LAFB  Labs (3/11): creatinine 1.1 Labs (7/15): K 4, creatinine 1.2, LDL 40, HDL 33, AST 44, ALT 32  PMH: 1. Paroxysmal atrial fibrillation: 1 episode in 2007 or 2008 after taking a diet pill.  Has not had a documented recurrence.  He is not on coumadin.  He had hallucinations with use of beta blocker.  2. Aortic stenosis: Moderate.  Echo (9/11) with EF 55%, mean aortic valve gradient 16 mmHg but visually AS appeared moderate.  Echo (8/15) with EF 55-60%, moderate AS mean gradient 31 mmHg with AVA 1.32 cm^2. 3. S/p cholecystectomy 4. Left heart cath in 2009 showed normal coronaries.  5. Mild transaminitis  SH: [place]  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   FH: Father died in car accident.  Mother had asthma.   ROS: All systems reviewed and negative except as per HPI.   Current Outpatient Prescriptions  Medication Sig Dispense Refill  . aspirin 81 MG tablet Take 81 mg by mouth daily.      . DHA-EPA-Vit B6-B12-Folic Acid (CARDIOVID PLUS PO) Take 1 tablet by mouth daily.     . Flaxseed, Linseed, (FLAXSEED OIL PO) Take 1 tablet by mouth daily.     . MULTIPLE VITAMIN PO Take 1 tablet by mouth daily.      . NON FORMULARY Take 1 tablet by mouth daily. Prostate 5LX      No current facility-administered medications for this visit.    BP 152/82 mmHg  Pulse 84  Ht 6\' 4"  (1.93 m)  Wt 210 lb 9.6 oz (95.528 kg)  BMI 25.65 kg/m2 General: NAD, overweight Neck: No JVD, no thyromegaly or thyroid nodule.  Lungs: Clear to auscultation bilaterally with normal respiratory effort. CV: Nondisplaced PMI.  Heart regular S1/S2, no S3/S4, 3/6 mid-peaking systolic crescendo-decrescendo murmur RUSB, S2 somewhat muffled.  No peripheral edema.  No carotid bruit.  Normal pedal pulses.  Abdomen: Soft, nontender, no hepatosplenomegaly, no distention.  Neurologic: Alert and oriented x 3.  Psych: Normal affect. Extremities: No clubbing or cyanosis.   Assessment/Plan: 1. Aortic stenosis: Moderate by 2015 echo.  Significant murmur on exam today but does not have symptoms suggestive of severe aortic stenosis.  I will get an echo to assess for progression of AS.  2. Hyperlipidemia: Check lipids today. 3. Paroxysmal atrial fibrillation: Only 1 episode has been documented (related to use of diet pills).  This was a number of years ago.  If he has a documented recurrence, should be anticoagulated.  For now, will take ASA 81 mg daily.   Marca AnconaDalton Linnet Bottari 12/03/2015

## 2015-12-03 NOTE — Patient Instructions (Signed)
Medication Instructions:  Your physician recommends that you continue on your current medications as directed. Please refer to the Current Medication list given to you today.   Labwork: Lipid profile/BMET/CBCd/TSH today  Testing/Procedures: Your physician has requested that you have an echocardiogram. Echocardiography is a painless test that uses sound waves to create images of your heart. It provides your doctor with information about the size and shape of your heart and how well your heart's chambers and valves are working. This procedure takes approximately one hour. There are no restrictions for this procedure.    Follow-Up: Your physician wants you to follow-up in: 1 year with Dr Shirlee LatchMcLean. (May 2018). You will receive a reminder letter in the mail two months in advance. If you don't receive a letter, please call our office to schedule the follow-up appointment.        If you need a refill on your cardiac medications before your next appointment, please call your pharmacy.

## 2015-12-18 ENCOUNTER — Other Ambulatory Visit: Payer: Self-pay

## 2015-12-18 ENCOUNTER — Ambulatory Visit (HOSPITAL_COMMUNITY): Payer: Medicare Other | Attending: Cardiovascular Disease

## 2015-12-18 DIAGNOSIS — I480 Paroxysmal atrial fibrillation: Secondary | ICD-10-CM | POA: Diagnosis not present

## 2015-12-18 DIAGNOSIS — I059 Rheumatic mitral valve disease, unspecified: Secondary | ICD-10-CM | POA: Insufficient documentation

## 2015-12-18 DIAGNOSIS — I119 Hypertensive heart disease without heart failure: Secondary | ICD-10-CM | POA: Diagnosis not present

## 2015-12-18 DIAGNOSIS — I10 Essential (primary) hypertension: Secondary | ICD-10-CM | POA: Diagnosis not present

## 2015-12-18 DIAGNOSIS — I350 Nonrheumatic aortic (valve) stenosis: Secondary | ICD-10-CM | POA: Insufficient documentation

## 2015-12-18 DIAGNOSIS — I352 Nonrheumatic aortic (valve) stenosis with insufficiency: Secondary | ICD-10-CM | POA: Diagnosis not present

## 2015-12-18 DIAGNOSIS — I359 Nonrheumatic aortic valve disorder, unspecified: Secondary | ICD-10-CM | POA: Diagnosis present

## 2015-12-18 LAB — ECHOCARDIOGRAM COMPLETE
AOPV: 0.29 m/s
AV Area VTI index: 0.4 cm2/m2
AV Area mean vel: 0.85 cm2
AV Mean grad: 32 mmHg
AV VEL mean LVOT/AV: 0.27
AV pk vel: 361 cm/s
AV vel: 0.9
AVAREAMEANVIN: 0.38 cm2/m2
AVAREAVTI: 0.9 cm2
AVPG: 52 mmHg
AVPHT: 708 ms
Area-P 1/2: 4.15 cm2
CHL CUP AV PEAK INDEX: 0.4
CHL CUP AV VALUE AREA INDEX: 0.4
CHL CUP DOP CALC LVOT VTI: 20.6 cm
CHL CUP LVOT MV VTI: 2.66
CHL CUP MV DEC (S): 268
CHL CUP TV REG PEAK VELOCITY: 236 cm/s
DOP CAL AO MEAN VELOCITY: 263 cm/s
E decel time: 268 msec
E/e' ratio: 3.84
FS: 36 % (ref 28–44)
IV/PV OW: 0.82
LA diam end sys: 55 mm
LA diam index: 2.43 cm/m2
LA vol index: 27.9 mL/m2
LA vol: 63 mL
LASIZE: 55 mm
LAVOLA4C: 45 mL
LDCA: 3.14 cm2
LV E/e'average: 3.84
LV PW d: 14.8 mm — AB (ref 0.6–1.1)
LV e' LATERAL: 11.3 cm/s
LVEEMED: 3.84
LVOT MV VTI INDEX: 1.18 cm2/m2
LVOT SV: 65 mL
LVOTD: 20 mm
LVOTPV: 104 cm/s
LVOTVTI: 0.29 cm
MV M vel: 67.8
MV pk A vel: 106 m/s
MV pk E vel: 43.4 m/s
MVANNULUSVTI: 24.3 cm
Mean grad: 2 mmHg
P 1/2 time: 53 ms
TDI e' lateral: 11.3
TDI e' medial: 4.06
TR max vel: 236 cm/s
VTI: 71.7 cm
Valve area: 0.9 cm2

## 2016-05-10 DIAGNOSIS — Z23 Encounter for immunization: Secondary | ICD-10-CM | POA: Diagnosis not present

## 2016-05-24 DIAGNOSIS — Z08 Encounter for follow-up examination after completed treatment for malignant neoplasm: Secondary | ICD-10-CM | POA: Diagnosis not present

## 2016-05-24 DIAGNOSIS — Z85828 Personal history of other malignant neoplasm of skin: Secondary | ICD-10-CM | POA: Diagnosis not present

## 2016-05-24 DIAGNOSIS — L570 Actinic keratosis: Secondary | ICD-10-CM | POA: Diagnosis not present

## 2016-11-22 DIAGNOSIS — L570 Actinic keratosis: Secondary | ICD-10-CM | POA: Diagnosis not present

## 2016-11-22 DIAGNOSIS — Z85828 Personal history of other malignant neoplasm of skin: Secondary | ICD-10-CM | POA: Diagnosis not present

## 2016-11-22 DIAGNOSIS — Z08 Encounter for follow-up examination after completed treatment for malignant neoplasm: Secondary | ICD-10-CM | POA: Diagnosis not present

## 2017-04-27 DIAGNOSIS — Z23 Encounter for immunization: Secondary | ICD-10-CM | POA: Diagnosis not present

## 2017-05-30 DIAGNOSIS — L570 Actinic keratosis: Secondary | ICD-10-CM | POA: Diagnosis not present

## 2017-05-30 DIAGNOSIS — Z08 Encounter for follow-up examination after completed treatment for malignant neoplasm: Secondary | ICD-10-CM | POA: Diagnosis not present

## 2017-05-30 DIAGNOSIS — Z85828 Personal history of other malignant neoplasm of skin: Secondary | ICD-10-CM | POA: Diagnosis not present

## 2017-12-02 DIAGNOSIS — Z08 Encounter for follow-up examination after completed treatment for malignant neoplasm: Secondary | ICD-10-CM | POA: Diagnosis not present

## 2017-12-02 DIAGNOSIS — L720 Epidermal cyst: Secondary | ICD-10-CM | POA: Diagnosis not present

## 2017-12-02 DIAGNOSIS — L570 Actinic keratosis: Secondary | ICD-10-CM | POA: Diagnosis not present

## 2017-12-02 DIAGNOSIS — L821 Other seborrheic keratosis: Secondary | ICD-10-CM | POA: Diagnosis not present

## 2017-12-02 DIAGNOSIS — Z85828 Personal history of other malignant neoplasm of skin: Secondary | ICD-10-CM | POA: Diagnosis not present

## 2018-03-21 DIAGNOSIS — H353131 Nonexudative age-related macular degeneration, bilateral, early dry stage: Secondary | ICD-10-CM | POA: Diagnosis not present

## 2018-03-21 DIAGNOSIS — H5201 Hypermetropia, right eye: Secondary | ICD-10-CM | POA: Diagnosis not present

## 2018-05-02 DIAGNOSIS — Z23 Encounter for immunization: Secondary | ICD-10-CM | POA: Diagnosis not present

## 2018-06-05 DIAGNOSIS — Z08 Encounter for follow-up examination after completed treatment for malignant neoplasm: Secondary | ICD-10-CM | POA: Diagnosis not present

## 2018-06-05 DIAGNOSIS — L570 Actinic keratosis: Secondary | ICD-10-CM | POA: Diagnosis not present

## 2018-06-05 DIAGNOSIS — L821 Other seborrheic keratosis: Secondary | ICD-10-CM | POA: Diagnosis not present

## 2018-06-05 DIAGNOSIS — Z85828 Personal history of other malignant neoplasm of skin: Secondary | ICD-10-CM | POA: Diagnosis not present

## 2020-09-20 ENCOUNTER — Emergency Department (HOSPITAL_COMMUNITY): Payer: Medicare Other

## 2020-09-20 ENCOUNTER — Encounter (HOSPITAL_COMMUNITY): Payer: Self-pay | Admitting: Emergency Medicine

## 2020-09-20 ENCOUNTER — Emergency Department (HOSPITAL_COMMUNITY)
Admission: EM | Admit: 2020-09-20 | Discharge: 2020-09-20 | Disposition: A | Discharge status: Home / Self Care | Payer: Medicare Other | Attending: Emergency Medicine | Admitting: Emergency Medicine

## 2020-09-20 ENCOUNTER — Other Ambulatory Visit: Payer: Self-pay

## 2020-09-20 DIAGNOSIS — I509 Heart failure, unspecified: Secondary | ICD-10-CM | POA: Insufficient documentation

## 2020-09-20 DIAGNOSIS — R1084 Generalized abdominal pain: Secondary | ICD-10-CM | POA: Insufficient documentation

## 2020-09-20 DIAGNOSIS — I110 Hypertensive heart disease with heart failure: Secondary | ICD-10-CM | POA: Diagnosis not present

## 2020-09-20 DIAGNOSIS — Z7982 Long term (current) use of aspirin: Secondary | ICD-10-CM | POA: Diagnosis not present

## 2020-09-20 DIAGNOSIS — R109 Unspecified abdominal pain: Secondary | ICD-10-CM

## 2020-09-20 LAB — URINALYSIS, ROUTINE W REFLEX MICROSCOPIC
Bilirubin Urine: NEGATIVE
Glucose, UA: NEGATIVE mg/dL
Hgb urine dipstick: NEGATIVE
Ketones, ur: NEGATIVE mg/dL
Leukocytes,Ua: NEGATIVE
Nitrite: NEGATIVE
Protein, ur: NEGATIVE mg/dL
Specific Gravity, Urine: 1.019 (ref 1.005–1.030)
pH: 6 (ref 5.0–8.0)

## 2020-09-20 LAB — RETICULOCYTES
Immature Retic Fract: 21.8 % — ABNORMAL HIGH (ref 2.3–15.9)
RBC.: 3.71 MIL/uL — ABNORMAL LOW (ref 4.22–5.81)
Retic Count, Absolute: 67.2 10*3/uL (ref 19.0–186.0)
Retic Ct Pct: 1.8 % (ref 0.4–3.1)

## 2020-09-20 LAB — IRON AND TIBC
Iron: 32 ug/dL — ABNORMAL LOW (ref 45–182)
Saturation Ratios: 9 % — ABNORMAL LOW (ref 17.9–39.5)
TIBC: 375 ug/dL (ref 250–450)
UIBC: 343 ug/dL

## 2020-09-20 LAB — COMPREHENSIVE METABOLIC PANEL
ALT: 28 U/L (ref 0–44)
AST: 39 U/L (ref 15–41)
Albumin: 2.9 g/dL — ABNORMAL LOW (ref 3.5–5.0)
Alkaline Phosphatase: 162 U/L — ABNORMAL HIGH (ref 38–126)
Anion gap: 5 (ref 5–15)
BUN: 16 mg/dL (ref 8–23)
CO2: 26 mmol/L (ref 22–32)
Calcium: 9.1 mg/dL (ref 8.9–10.3)
Chloride: 105 mmol/L (ref 98–111)
Creatinine, Ser: 1.03 mg/dL (ref 0.61–1.24)
GFR, Estimated: >60 mL/min (ref >60)
Glucose, Bld: 113 mg/dL — ABNORMAL HIGH (ref 70–99)
Potassium: 3.8 mmol/L (ref 3.5–5.1)
Sodium: 136 mmol/L (ref 135–145)
Total Bilirubin: 1.3 mg/dL — ABNORMAL HIGH (ref 0.3–1.2)
Total Protein: 5.9 g/dL — ABNORMAL LOW (ref 6.5–8.1)

## 2020-09-20 LAB — CBC
HCT: 30.1 % — ABNORMAL LOW (ref 39.0–52.0)
Hemoglobin: 9 g/dL — ABNORMAL LOW (ref 13.0–17.0)
MCH: 23.4 pg — ABNORMAL LOW (ref 26.0–34.0)
MCHC: 29.9 g/dL — ABNORMAL LOW (ref 30.0–36.0)
MCV: 78.2 fL — ABNORMAL LOW (ref 80.0–100.0)
Platelets: 174 10*3/uL (ref 150–400)
RBC: 3.85 MIL/uL — ABNORMAL LOW (ref 4.22–5.81)
RDW: 19.4 % — ABNORMAL HIGH (ref 11.5–15.5)
WBC: 9.8 10*3/uL (ref 4.0–10.5)
nRBC: 0 % (ref 0.0–0.2)

## 2020-09-20 LAB — VITAMIN B12: Vitamin B-12: 338 pg/mL (ref 180–914)

## 2020-09-20 LAB — LIPASE, BLOOD: Lipase: 39 U/L (ref 11–51)

## 2020-09-20 LAB — FOLATE: Folate: 14.8 ng/mL (ref >5.9)

## 2020-09-20 LAB — FERRITIN: Ferritin: 10 ng/mL — ABNORMAL LOW (ref 24–336)

## 2020-09-20 MED ORDER — IOHEXOL 300 MG/ML  SOLN
100.0000 mL | Freq: Once | INTRAMUSCULAR | Status: AC | PRN
  Administered 2020-09-20: 100 mL via INTRAVENOUS

## 2020-09-20 MED ORDER — GADOBUTROL 1 MMOL/ML IV SOLN
10.0000 mL | Freq: Once | INTRAVENOUS | Status: AC | PRN
  Administered 2020-09-20: 10 mL via INTRAVENOUS

## 2020-09-20 NOTE — ED Notes (Signed)
DC instructions reviewed with the pt.  Pt verbalized understanding.  PT DC.  

## 2020-09-20 NOTE — ED Provider Notes (Addendum)
[facility] EMERGENCY DEPARTMENT Provider Note   CSN: [id number] Arrival date & time: 09/20/20  0503     History Chief Complaint  Patient presents with  . Abdominal Pain    Gavin Lane is a 87 y.o. male.  Patient c/o generalized abd pain. Symptoms acute onset yesterday, moderate, dull, constant, non radiating, diffuse/bilateral. Felt bloated/distended. No vomiting. States last bm 2 days ago. Prior abd surgery includes remote hx cholecystectomy. Denies dysuria or gu c/o. No fever or chills. No faintness or dizziness.   The history is provided by the patient.  Abdominal Pain Associated symptoms: no chest pain, no diarrhea, no dysuria, no fever, no shortness of breath, no sore throat and no vomiting        Past Medical History:  Diagnosis Date  . AAA (abdominal aortic aneurysm) (HCC)   . Aortic valve disorders   . Atrial fibrillation (HCC)   . Heart failure, unspecified (HCC)   . History of shingles   . Paroxysmal supraventricular tachycardia [facility])     Patient Active Problem List   Diagnosis Date Noted  . Aortic stenosis 12/03/2015  . Abnormal liver function test 07/31/2011  . Hyperglycemia 07/31/2011  . Cyst in hand 07/31/2011  . HYPERTENSION, BENIGN 02/28/2009  . AORTIC STENOSIS, MILD 02/27/2009  . PAROXYSMAL SUPRAVENTRICULAR TACHYCARDIA 02/27/2009  . ATRIAL FIBRILLATION, PAROXYSMAL 02/27/2009  . HEART FAILURE 02/27/2009  . ABDOMINAL AORTIC ANEURYSM, HX OF 02/27/2009    Past Surgical History:  Procedure Laterality Date  . CHOLECYSTECTOMY  2005  . CYSTOSCOPY  1945   kidney stones  . INGUINAL HERNIA REPAIR  over 30 years ago       Family History  Problem Relation Age of Onset  . Asthma Mother   . Colon cancer Neg Hx   . Breast cancer Neg Hx   . Diabetes Neg Hx   . Prostate cancer Neg Hx   . Hypertension Neg Hx   . Heart disease Neg Hx     Social History   Tobacco Use  . Smoking status: Never Smoker  . Smokeless tobacco:  Never Used  Substance Use Topics  . Alcohol use: No  . Drug use: No    Home Medications Prior to Admission medications   Medication Sig Start Date End Date Taking? Authorizing Provider  aspirin 81 MG tablet Take 81 mg by mouth daily.      [provider]  DHA-EPA-Vit B6-B12-Folic Acid (CARDIOVID PLUS PO) Take 1 tablet by mouth daily.     [provider]  Flaxseed, Linseed, (FLAXSEED OIL PO) Take 1 tablet by mouth daily.     [provider]  MULTIPLE VITAMIN PO Take 1 tablet by mouth daily.      [provider]  NON FORMULARY Take 1 tablet by mouth daily. Prostate 5LX    [provider]    Allergies    Patient has no known allergies.  Review of Systems   Review of Systems  Constitutional: Negative for fever.  HENT: Negative for sore throat.   Eyes: Negative for redness.  Respiratory: Negative for shortness of breath.   Cardiovascular: Negative for chest pain.  Gastrointestinal: Positive for abdominal pain. Negative for diarrhea and vomiting.  Genitourinary: Negative for dysuria and flank pain.  Musculoskeletal: Negative for neck pain.  Skin: Negative for rash.  Neurological: Negative for headaches.  Hematological: Does not bruise/bleed easily.  Psychiatric/Behavioral: Negative for confusion.    Physical Exam Updated Vital Signs BP 109/71 (BP Location: Left  Arm)   Pulse 62   Temp 98.2 F (36.8 C) (Oral)   Resp 16   Ht 1.905 m (6\' 3" )   Wt 100 kg   SpO2 98%   BMI 27.56 kg/m   Physical Exam Vitals and nursing note reviewed.  Constitutional:      Appearance: Normal appearance. He is well-developed.  HENT:     Head: Atraumatic.     Nose: Nose normal.     Mouth/Throat:     Mouth: Mucous membranes are moist.     Pharynx: Oropharynx is clear.  Eyes:     General: No scleral icterus.    Conjunctiva/sclera: Conjunctivae normal.  Neck:     Trachea: No tracheal deviation.  Cardiovascular:     Rate and Rhythm: Normal rate  and regular rhythm.     Pulses: Normal pulses.     Heart sounds: Normal heart sounds. No murmur heard. No friction rub. No gallop.   Pulmonary:     Effort: Pulmonary effort is normal. No accessory muscle usage or respiratory distress.     Breath sounds: Normal breath sounds.  Abdominal:     General: Bowel sounds are normal. There is no distension.     Palpations: Abdomen is soft.     Tenderness: There is abdominal tenderness. There is no guarding.     Comments: Mid abd tenderness. No rebound or guarding. No pulsatile mass felt.  Genitourinary:    Comments: No cva tenderness. Musculoskeletal:        General: No swelling.     Cervical back: Normal range of motion and neck supple. No rigidity.  Skin:    General: Skin is warm and dry.     Findings: No rash.  Neurological:     Mental Status: He is alert.     Comments: Alert, speech clear.   Psychiatric:        Mood and Affect: Mood normal.     ED Results / Procedures / Treatments   Labs (all labs ordered are listed, but only abnormal results are displayed) Results for orders placed or performed during the hospital encounter of 09/20/20  Lipase, blood  Result Value Ref Range   Lipase 39 11 - 51 U/L  Comprehensive metabolic panel  Result Value Ref Range   Sodium 136 135 - 145 mmol/L   Potassium 3.8 3.5 - 5.1 mmol/L   Chloride 105 98 - 111 mmol/L   CO2 26 22 - 32 mmol/L   Glucose, Bld 113 (H) 70 - 99 mg/dL   BUN 16 8 - 23 mg/dL   Creatinine, Ser 09/22/20 0.61 - 1.24 mg/dL   Calcium 9.1 8.9 - 3.29 mg/dL   Total Protein 5.9 (L) 6.5 - 8.1 g/dL   Albumin 2.9 (L) 3.5 - 5.0 g/dL   AST 39 15 - 41 U/L   ALT 28 0 - 44 U/L   Alkaline Phosphatase 162 (H) 38 - 126 U/L   Total Bilirubin 1.3 (H) 0.3 - 1.2 mg/dL   GFR, Estimated 92.4 >26 mL/min   Anion gap 5 5 - 15  CBC  Result Value Ref Range   WBC 9.8 4.0 - 10.5 K/uL   RBC 3.85 (L) 4.22 - 5.81 MIL/uL   Hemoglobin 9.0 (L) 13.0 - 17.0 g/dL   HCT >83 (L) 41.9 - 62.2 %   MCV 78.2 (L)  80.0 - 100.0 fL   MCH 23.4 (L) 26.0 - 34.0 pg   MCHC 29.9 (L) 30.0 - 36.0 g/dL   RDW  19.4 (H) 11.5 - 15.5 %   Platelets 174 150 - 400 K/uL   nRBC 0.0 0.0 - 0.2 %   EKG EKG Interpretation  Date/Time:  Saturday September 20 2020 05:18:47 EDT Ventricular Rate:  91 PR Interval:  204 QRS Duration: 92 QT Interval:  380 QTC Calculation: 467 R Axis:   -70 Text Interpretation: Sinus rhythm with Premature atrial complexes Incomplete right bundle branch block Left anterior fascicular block Nonspecific T wave abnormality Confirmed by Cathren Laine ([id number]) on 09/20/2020 11:15:59 AM   Radiology No results found.  Procedures Procedures   Medications Ordered in ED Medications - No data to display  ED Course  I have reviewed the triage vital signs and the nursing notes.  Pertinent labs & imaging results that were available during my care of the patient were reviewed by me and considered in my medical decision making (see chart for details).    MDM Rules/Calculators/A&P                          Iv ns. Stat labs.   Reviewed nursing notes and prior charts for additional history.   Labs reviewed/interpreted by me - chem normal. Wbc normal. Hgb is decreased from prior, but not recent labs to compare (anemia is hypochromic, microcytic). Pt denies blood loss, rectal bleeding or melena. Anemia panel added to labs.   CT pending.   Recheck pt, no change in exam. Currently comfortable. CT not done - radiology called re ?delay in CT - they indicate will get to patient as soon as able.   1518, ct pending - signed out to Dr Freida Busman to check CT/pending labs, recheck pt and dispo appropriately.      Final Clinical Impression(s) / ED Diagnoses Final diagnoses:  None    Rx / DC Orders ED Discharge Orders    None           Cathren Laine, MD 09/20/20 1520

## 2020-09-20 NOTE — ED Triage Notes (Signed)
Patient reports generalized abdominal pain and mid back pain this evening , no emesis or diarrhea , denies fever or chills .

## 2020-09-20 NOTE — ED Provider Notes (Signed)
Patient signed to me by Dr. Denton Lank pending results of abdominal CT.  Findings suspicious for possible pancreatic neoplasm.  Radiologist recommended patient have MRI of abdomen which was performed and did not show any evidence of pancreatic cancer.  Patient's pain is controlled this time.  Will discharge home   Gavin Nick, MD 09/20/20 1906

## 2021-01-02 DEATH — deceased

## 2022-06-27 IMAGING — CT CT ABD-PELV W/ CM
2 of 5 series · 15 of 46 positions shown, 17 images · IV contrast (APPLIED)
Comparison: Ultrasound abdomen 11/16/2004, x-ray lumbar spine
03/31/2011 report without imaging

CLINICAL DATA: Abdominal pain, acute nonlocalized.

EXAM:
CT ABDOMEN AND PELVIS WITH CONTRAST
TECHNIQUE: Multidetector CT imaging of the abdomen and pelvis was performed
using the standard protocol following bolus administration of
intravenous contrast.
CONTRAST:  100mL OMNIPAQUE IOHEXOL 300 MG/ML  SOLN

[Series 3: abd/ pelvis 5.0 i30f 2 · axial · 0.82mm/px · z∈[+720,+1195]mm · 12 of 107 slices shown, 14 images]
[im 6/107  soft-tissue]
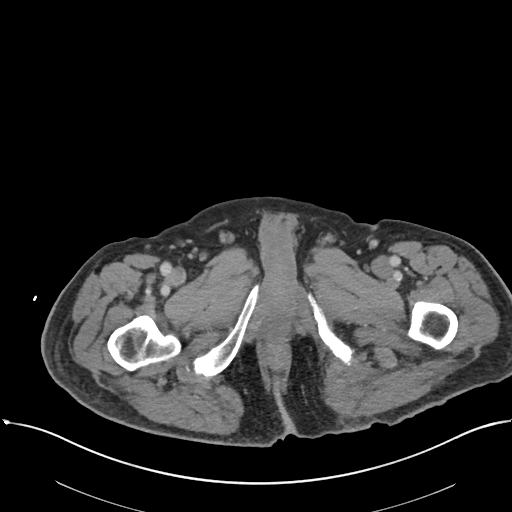
[im 6/107  bone]
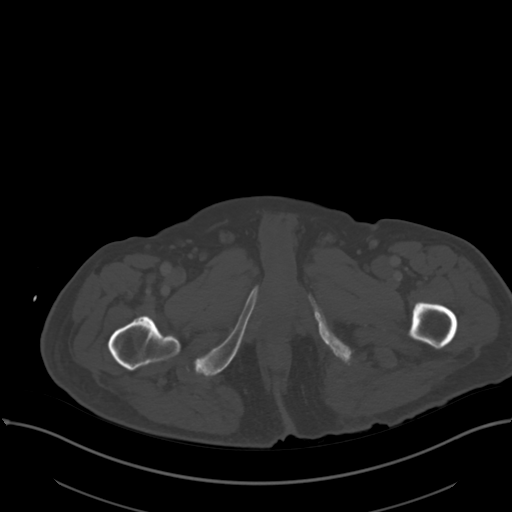
[im 16/107  soft-tissue]
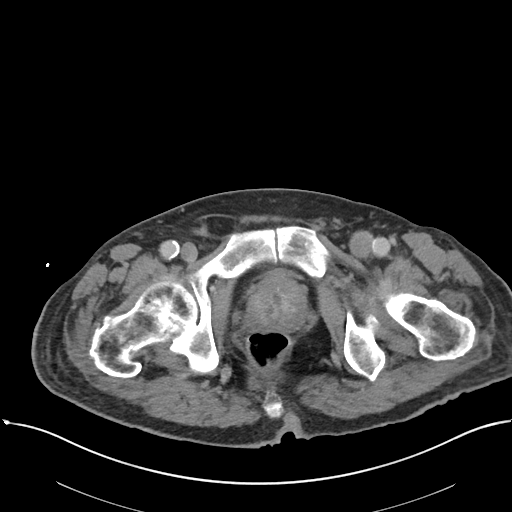
[im 22/107  soft-tissue]
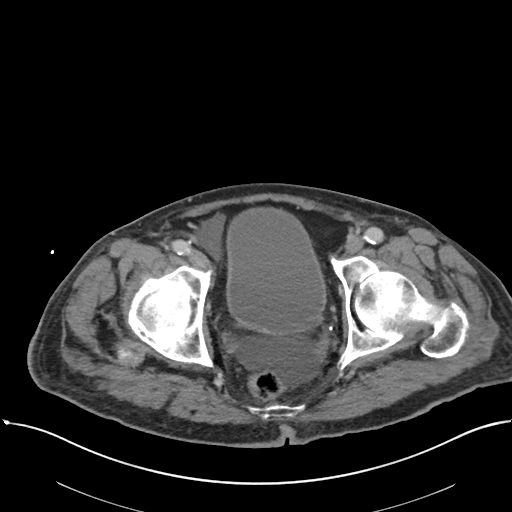
[im 32/107  soft-tissue]
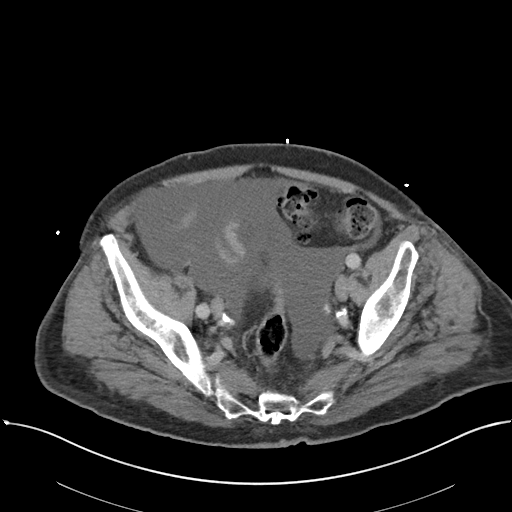
[im 43/107  soft-tissue]
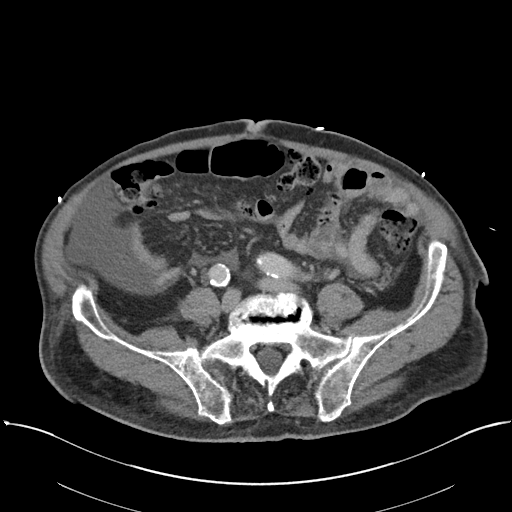
[im 48/107  soft-tissue]
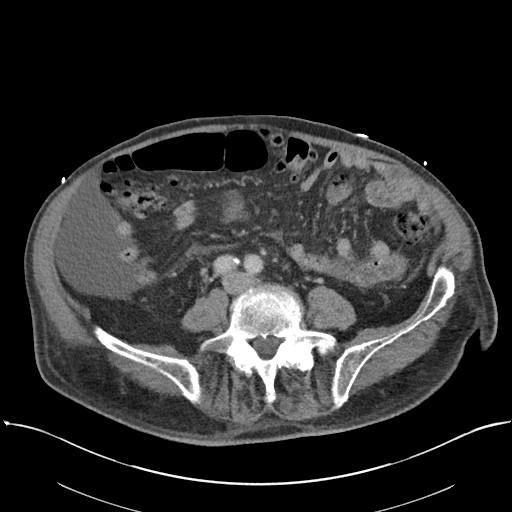
[im 59/107  soft-tissue]
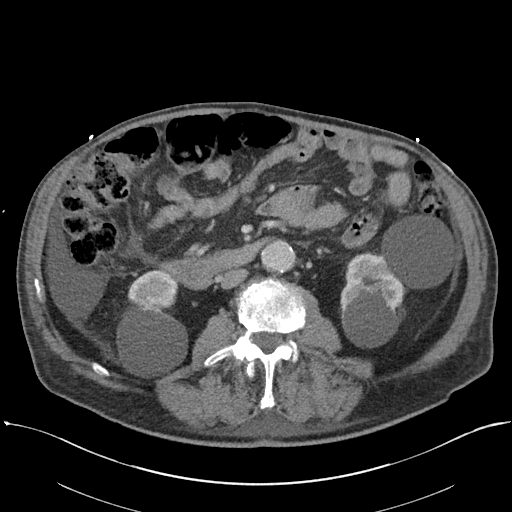
[im 64/107  soft-tissue]
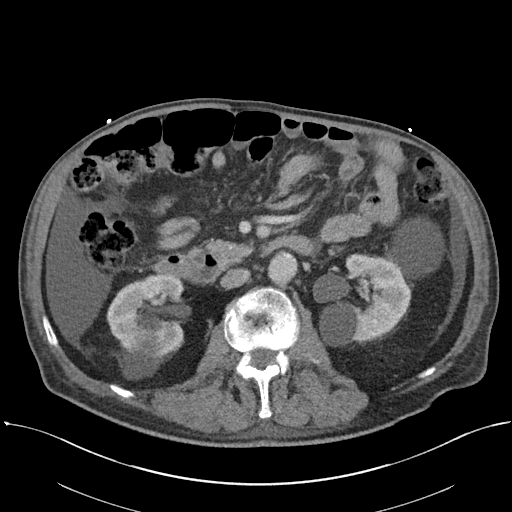
[im 75/107  soft-tissue]
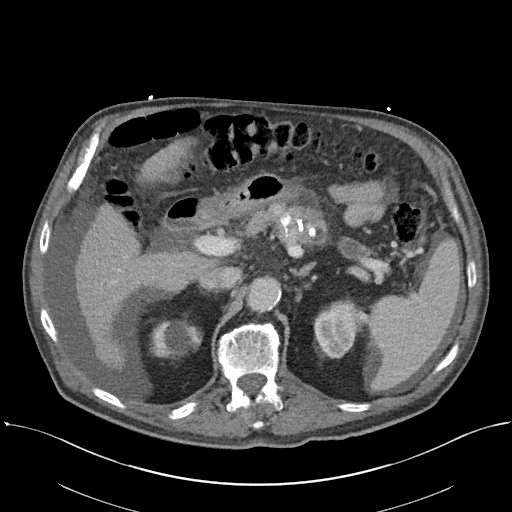
[im 75/107  bone]
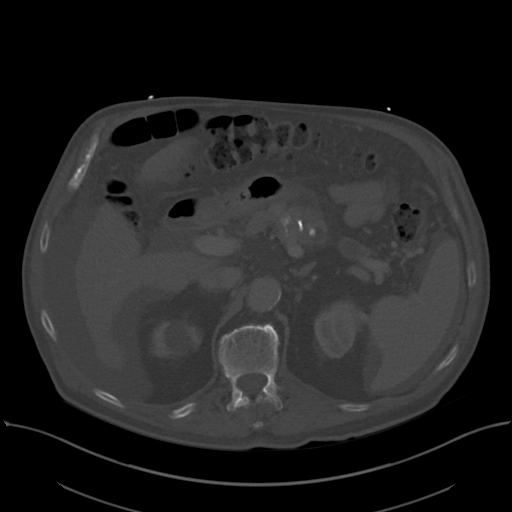
[im 85/107  soft-tissue]
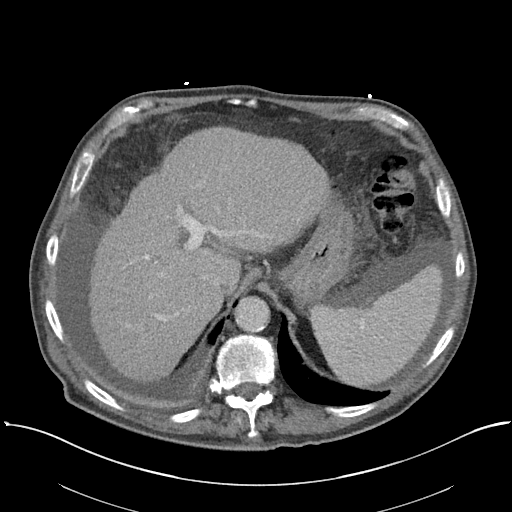
[im 91/107  soft-tissue]
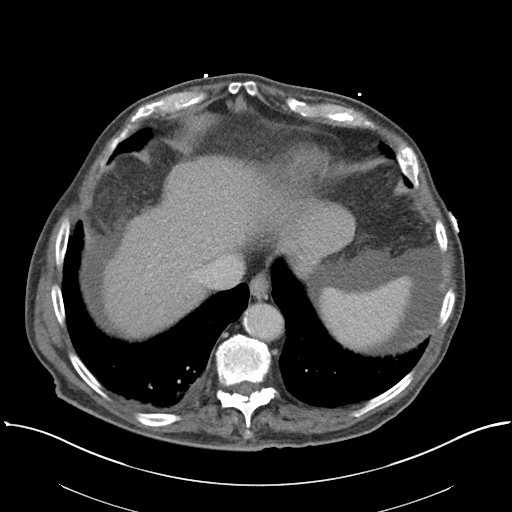
[im 101/107  soft-tissue]
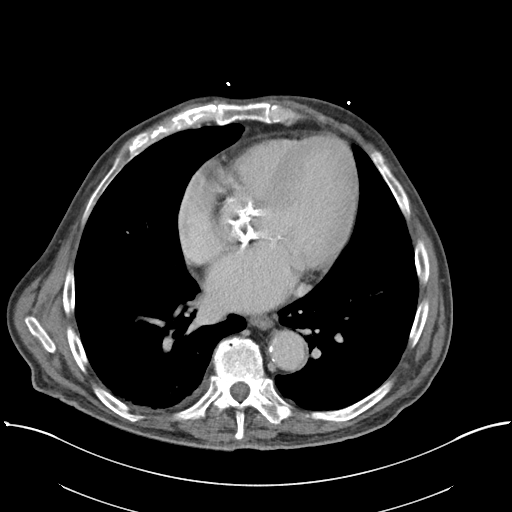

[Series 6: coronal soft tissue · coronal · 0.90mm/px · 3 of 101 slices shown]
[im 34/101  soft-tissue]
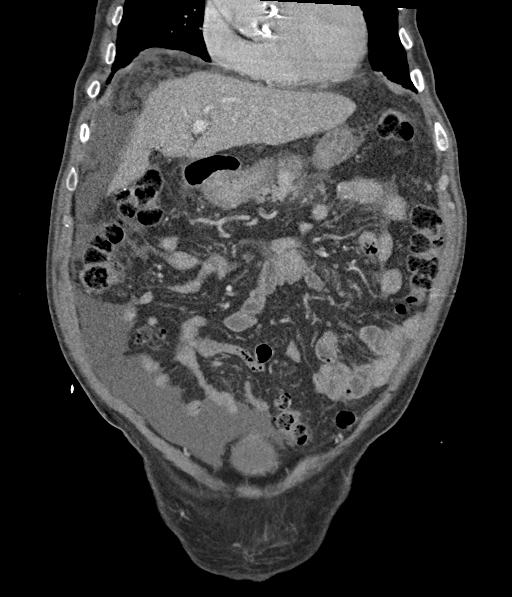
[im 45/101  soft-tissue]
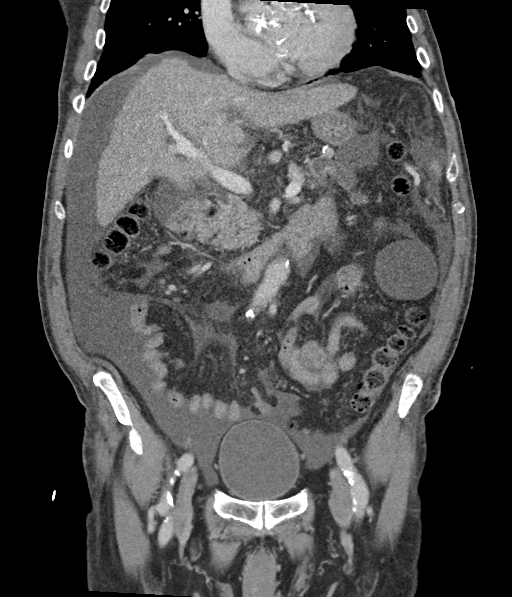
[im 56/101  soft-tissue]
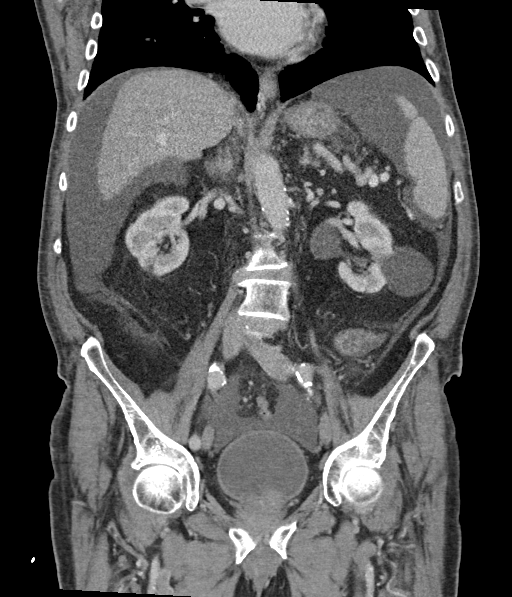

[15 of 46 positions shown; findings below may reference images not displayed]

FINDINGS: Lower chest: Trace right effusion. Associated passive atelectasis of
the right lower lobe. At least mild to moderate mitral annular
calcifications.

Hepatobiliary: Nodular hepatic contour. Subcentimeter hypodensities
too small to characterize ([DATE]). Status post cholecystectomy. No
biliary dilatation.

Pancreas: Markedmain pancreatic duct dilatation along the distal
pancreatic body and pancreatic tail with a possible obstructive
proximal ([DATE], [DATE]) pancreatic calcification measuring up to
cm ([DATE]). Normal pancreatic contour. No surrounding inflammatory
changes. No main pancreatic ductal dilatation.

Spleen: The spleen is enlarged measuring up to 15 cm. No focal
splenic lesion.

Adrenals/Urinary Tract: No adrenal nodule bilaterally. Bilateral
kidneys enhance symmetrically. Bilateral fluid density lesions
within the kidneys likely represent simple renal cysts. Couple of
the cysts on the left demonstrate thin septations ([DATE], [DATE])
likely representing minimally complex cysts. No hydronephrosis. No
hydroureter. The urinary bladder is unremarkable. On delayed
imaging, there is no urothelial wall thickening and there are no
filling defects in the opacified portions of the bilateral
collecting systems or ureters.

Stomach/Bowel: Stomach is within normal limits. No evidence of bowel
wall thickening or dilatation. Couple of second portion of the
duodenum diverticula ([DATE], [DATE]). Diffuse sigmoid diverticulosis.
The appendix is not definitely identified.

Vascular/Lymphatic: No abdominal aorta or iliac aneurysm. Mild
atherosclerotic plaque of the aorta and its branches. No abdominal,
pelvic, or inguinal lymphadenopathy.

Reproductive: The prostate is enlarged measuring up to 5 cm.
Suggestion of possible change Haskett oral resection of the prostate.

Other: No intraperitoneal free fluid. No intraperitoneal free gas.
No organized fluid collection.

Musculoskeletal:

Tiny fat containing right inguinal hernia.

No suspicious lytic or blastic osseous lesions. No acute displaced
fracture. Grade 1 anterolisthesis of L4 on L5. Mild retrolisthesis
of L2 on L3 and L5 on S1. Multilevel degenerative changes of the
spine with multilevel intervertebral disc space narrowing and
intervertebral disc space vacuum phenomenon
IMPRESSION: 1. Marked main pancreatic duct dilatation along the distal
pancreatic body and pancreatic tail with a possibly obstructive
proximal ([DATE]) main pancreatic calcification measuring up to
cm. No findings of inflammatory changes to suggest acute
pancreatitis. Underlying proximal mass lesion or intraductal
mucinous papillary neoplasm not excluded. Recommend MRI pancreatic
protocol for further evaluation.
2. Cirrhosis and portal hypertension with an indeterminate
subcentimeter hypodensity. Finding can be further evaluated on MRI
recommended above.
3. Moderate volume ascites.
4. Diffuse sigmoid diverticulosis with no acute diverticulitis.
5. Prostatomegaly.
6. Minimally complex left renal cystic lesions demonstrating thin
septations. Finding can be further evaluated on MRI recommended
above.
7.  Aortic Atherosclerosis ([id number]).
